# Patient Record
Sex: Female | Born: 1955 | Race: White | Hispanic: No | Smoking: Former smoker
Health system: Southern US, Community
[De-identification: ages and names within clinical notes are randomized; demographics above are authoritative.]

## PROBLEM LIST (undated history)

## (undated) DIAGNOSIS — J309 Allergic rhinitis, unspecified: Secondary | ICD-10-CM

## (undated) DIAGNOSIS — R5383 Other fatigue: Secondary | ICD-10-CM

## (undated) DIAGNOSIS — E559 Vitamin D deficiency, unspecified: Secondary | ICD-10-CM

## (undated) DIAGNOSIS — E039 Hypothyroidism, unspecified: Secondary | ICD-10-CM

## (undated) DIAGNOSIS — J45909 Unspecified asthma, uncomplicated: Secondary | ICD-10-CM

## (undated) DIAGNOSIS — Z90710 Acquired absence of both cervix and uterus: Secondary | ICD-10-CM

## (undated) DIAGNOSIS — G4730 Sleep apnea, unspecified: Secondary | ICD-10-CM

## (undated) DIAGNOSIS — J310 Chronic rhinitis: Secondary | ICD-10-CM

## (undated) DIAGNOSIS — G4710 Hypersomnia, unspecified: Secondary | ICD-10-CM

## (undated) DIAGNOSIS — G629 Polyneuropathy, unspecified: Secondary | ICD-10-CM

## (undated) DIAGNOSIS — I639 Cerebral infarction, unspecified: Secondary | ICD-10-CM

## (undated) DIAGNOSIS — R0600 Dyspnea, unspecified: Secondary | ICD-10-CM

## (undated) DIAGNOSIS — K219 Gastro-esophageal reflux disease without esophagitis: Secondary | ICD-10-CM

## (undated) DIAGNOSIS — M797 Fibromyalgia: Secondary | ICD-10-CM

## (undated) DIAGNOSIS — K5090 Crohn's disease, unspecified, without complications: Secondary | ICD-10-CM

## (undated) DIAGNOSIS — F329 Major depressive disorder, single episode, unspecified: Secondary | ICD-10-CM

## (undated) HISTORY — DX: Gastro-esophageal reflux disease without esophagitis: K21.9

## (undated) HISTORY — DX: Major depressive disorder, single episode, unspecified: F32.9

## (undated) HISTORY — DX: Crohn's disease, unspecified, without complications: K50.90

## (undated) HISTORY — DX: Vitamin D deficiency, unspecified: E55.9

## (undated) HISTORY — DX: Fibromyalgia: M79.7

## (undated) HISTORY — DX: Unspecified asthma, uncomplicated: J45.909

## (undated) HISTORY — DX: Allergic rhinitis, unspecified: J30.9

## (undated) HISTORY — DX: Hypersomnia, unspecified: G47.10

## (undated) HISTORY — DX: Hypothyroidism, unspecified: E03.9

## (undated) HISTORY — PX: CHOLECYSTECTOMY: SHX55

## (undated) HISTORY — DX: Polyneuropathy, unspecified: G62.9

## (undated) HISTORY — DX: Hypersomnia, unspecified: G47.30

## (undated) HISTORY — DX: Acquired absence of both cervix and uterus: Z90.710

## (undated) HISTORY — DX: Chronic rhinitis: J31.0

## (undated) HISTORY — DX: Other fatigue: R53.83

## (undated) HISTORY — DX: Dyspnea, unspecified: R06.00

---

## 1998-08-24 ENCOUNTER — Inpatient Hospital Stay (HOSPITAL_COMMUNITY): Admission: EM | Admit: 1998-08-24 | Discharge: 1998-08-28 | Payer: Self-pay | Admitting: Psychiatry

## 1998-12-27 ENCOUNTER — Encounter: Payer: Self-pay | Admitting: Pulmonary Disease

## 1998-12-27 ENCOUNTER — Inpatient Hospital Stay (HOSPITAL_COMMUNITY): Admission: EM | Admit: 1998-12-27 | Discharge: 1999-01-02 | Payer: Self-pay | Admitting: Pulmonary Disease

## 1998-12-28 ENCOUNTER — Encounter: Payer: Self-pay | Admitting: Pulmonary Disease

## 1999-01-01 ENCOUNTER — Encounter: Payer: Self-pay | Admitting: Pulmonary Disease

## 1999-03-12 ENCOUNTER — Inpatient Hospital Stay (HOSPITAL_COMMUNITY): Admission: RE | Admit: 1999-03-12 | Discharge: 1999-03-13 | Payer: Self-pay | Admitting: Specialist

## 1999-08-06 ENCOUNTER — Inpatient Hospital Stay (HOSPITAL_COMMUNITY): Admission: EM | Admit: 1999-08-06 | Discharge: 1999-08-15 | Payer: Self-pay | Admitting: *Deleted

## 1999-08-06 ENCOUNTER — Emergency Department (HOSPITAL_COMMUNITY): Admission: EM | Admit: 1999-08-06 | Discharge: 1999-08-06 | Payer: Self-pay | Admitting: Emergency Medicine

## 2000-01-13 ENCOUNTER — Inpatient Hospital Stay (HOSPITAL_COMMUNITY): Admission: EM | Admit: 2000-01-13 | Discharge: 2000-01-16 | Payer: Self-pay | Admitting: *Deleted

## 2000-01-16 ENCOUNTER — Inpatient Hospital Stay (HOSPITAL_COMMUNITY): Admission: EM | Admit: 2000-01-16 | Discharge: 2000-01-18 | Payer: Self-pay | Admitting: Emergency Medicine

## 2000-01-16 ENCOUNTER — Encounter: Payer: Self-pay | Admitting: *Deleted

## 2000-01-17 ENCOUNTER — Ambulatory Visit (HOSPITAL_COMMUNITY): Admission: RE | Admit: 2000-01-17 | Discharge: 2000-01-17 | Payer: Self-pay | Admitting: *Deleted

## 2000-01-17 ENCOUNTER — Encounter: Payer: Self-pay | Admitting: Pulmonary Disease

## 2000-01-18 ENCOUNTER — Inpatient Hospital Stay (HOSPITAL_COMMUNITY): Admission: EM | Admit: 2000-01-18 | Discharge: 2000-01-21 | Payer: Self-pay

## 2000-03-12 ENCOUNTER — Inpatient Hospital Stay (HOSPITAL_COMMUNITY): Admission: AD | Admit: 2000-03-12 | Discharge: 2000-03-13 | Payer: Self-pay | Admitting: Internal Medicine

## 2000-05-16 ENCOUNTER — Inpatient Hospital Stay (HOSPITAL_COMMUNITY): Admission: EM | Admit: 2000-05-16 | Discharge: 2000-05-18 | Payer: Self-pay | Admitting: *Deleted

## 2000-05-27 ENCOUNTER — Inpatient Hospital Stay (HOSPITAL_COMMUNITY): Admission: EM | Admit: 2000-05-27 | Discharge: 2000-06-01 | Payer: Self-pay | Admitting: *Deleted

## 2000-05-29 ENCOUNTER — Encounter: Payer: Self-pay | Admitting: Emergency Medicine

## 2000-06-09 ENCOUNTER — Inpatient Hospital Stay (HOSPITAL_COMMUNITY): Admission: EM | Admit: 2000-06-09 | Discharge: 2000-06-18 | Payer: Self-pay | Admitting: *Deleted

## 2000-06-14 ENCOUNTER — Encounter: Payer: Self-pay | Admitting: Internal Medicine

## 2000-12-22 ENCOUNTER — Inpatient Hospital Stay (HOSPITAL_COMMUNITY): Admission: EM | Admit: 2000-12-22 | Discharge: 2000-12-29 | Payer: Self-pay | Admitting: *Deleted

## 2001-03-16 ENCOUNTER — Inpatient Hospital Stay (HOSPITAL_COMMUNITY): Admission: EM | Admit: 2001-03-16 | Discharge: 2001-03-30 | Payer: Self-pay | Admitting: *Deleted

## 2001-03-30 ENCOUNTER — Inpatient Hospital Stay (HOSPITAL_COMMUNITY): Admission: EM | Admit: 2001-03-30 | Discharge: 2001-04-04 | Payer: Self-pay | Admitting: Emergency Medicine

## 2001-03-30 ENCOUNTER — Encounter: Payer: Self-pay | Admitting: Internal Medicine

## 2001-04-01 ENCOUNTER — Encounter: Payer: Self-pay | Admitting: Internal Medicine

## 2001-04-03 ENCOUNTER — Encounter: Payer: Self-pay | Admitting: Internal Medicine

## 2001-10-26 ENCOUNTER — Encounter: Payer: Self-pay | Admitting: Gastroenterology

## 2001-10-26 ENCOUNTER — Encounter: Admission: RE | Admit: 2001-10-26 | Discharge: 2001-10-26 | Payer: Self-pay | Admitting: Gastroenterology

## 2002-12-07 ENCOUNTER — Ambulatory Visit (HOSPITAL_COMMUNITY): Admission: RE | Admit: 2002-12-07 | Discharge: 2002-12-07 | Payer: Self-pay | Admitting: Gastroenterology

## 2003-04-01 ENCOUNTER — Encounter: Payer: Self-pay | Admitting: Gastroenterology

## 2003-04-01 ENCOUNTER — Inpatient Hospital Stay (HOSPITAL_COMMUNITY): Admission: EM | Admit: 2003-04-01 | Discharge: 2003-04-10 | Payer: Self-pay

## 2003-04-03 ENCOUNTER — Encounter: Payer: Self-pay | Admitting: Gastroenterology

## 2003-04-08 ENCOUNTER — Encounter: Payer: Self-pay | Admitting: Gastroenterology

## 2003-11-11 ENCOUNTER — Inpatient Hospital Stay (HOSPITAL_COMMUNITY): Admission: EM | Admit: 2003-11-11 | Discharge: 2003-11-17 | Payer: Self-pay | Admitting: Psychiatry

## 2004-05-01 ENCOUNTER — Inpatient Hospital Stay (HOSPITAL_COMMUNITY): Admission: EM | Admit: 2004-05-01 | Discharge: 2004-05-06 | Payer: Self-pay | Admitting: Psychiatry

## 2004-08-27 ENCOUNTER — Ambulatory Visit: Payer: Self-pay | Admitting: Oncology

## 2005-02-06 ENCOUNTER — Ambulatory Visit: Payer: Self-pay | Admitting: Psychiatry

## 2005-02-06 ENCOUNTER — Inpatient Hospital Stay (HOSPITAL_COMMUNITY): Admission: RE | Admit: 2005-02-06 | Discharge: 2005-02-10 | Payer: Self-pay | Admitting: Psychiatry

## 2005-02-27 ENCOUNTER — Ambulatory Visit: Payer: Self-pay | Admitting: Hematology and Oncology

## 2005-05-15 ENCOUNTER — Ambulatory Visit: Payer: Self-pay | Admitting: Psychiatry

## 2005-05-15 ENCOUNTER — Inpatient Hospital Stay (HOSPITAL_COMMUNITY): Admission: EM | Admit: 2005-05-15 | Discharge: 2005-05-18 | Payer: Self-pay | Admitting: Psychiatry

## 2005-05-20 ENCOUNTER — Ambulatory Visit: Payer: Self-pay | Admitting: Oncology

## 2005-11-11 ENCOUNTER — Ambulatory Visit: Payer: Self-pay | Admitting: Oncology

## 2006-12-11 ENCOUNTER — Encounter: Admission: RE | Admit: 2006-12-11 | Discharge: 2006-12-11 | Payer: Self-pay | Admitting: Internal Medicine

## 2012-09-15 ENCOUNTER — Other Ambulatory Visit (HOSPITAL_COMMUNITY): Payer: Self-pay | Admitting: Adult Health

## 2012-09-15 DIAGNOSIS — R0600 Dyspnea, unspecified: Secondary | ICD-10-CM

## 2012-09-21 ENCOUNTER — Encounter (HOSPITAL_COMMUNITY): Payer: Self-pay | Admitting: *Deleted

## 2012-09-21 ENCOUNTER — Encounter (HOSPITAL_COMMUNITY): Payer: Self-pay | Admitting: Internal Medicine

## 2012-09-28 ENCOUNTER — Encounter (HOSPITAL_COMMUNITY): Payer: Self-pay | Admitting: Pharmacy Technician

## 2012-10-06 ENCOUNTER — Ambulatory Visit (HOSPITAL_COMMUNITY)
Admission: RE | Admit: 2012-10-06 | Discharge: 2012-10-06 | Disposition: A | Discharge status: Home / Self Care | Payer: Medicare Other | Source: Ambulatory Visit | Attending: Internal Medicine | Admitting: Internal Medicine

## 2012-10-06 ENCOUNTER — Encounter (HOSPITAL_COMMUNITY)
Admission: RE | Disposition: A | Discharge status: Home / Self Care | Payer: Self-pay | Source: Ambulatory Visit | Attending: Internal Medicine

## 2012-10-06 ENCOUNTER — Other Ambulatory Visit: Payer: Self-pay

## 2012-10-06 DIAGNOSIS — R079 Chest pain, unspecified: Secondary | ICD-10-CM | POA: Insufficient documentation

## 2012-10-06 DIAGNOSIS — R0600 Dyspnea, unspecified: Secondary | ICD-10-CM

## 2012-10-06 DIAGNOSIS — I2510 Atherosclerotic heart disease of native coronary artery without angina pectoris: Secondary | ICD-10-CM | POA: Insufficient documentation

## 2012-10-06 DIAGNOSIS — Z7901 Long term (current) use of anticoagulants: Secondary | ICD-10-CM | POA: Insufficient documentation

## 2012-10-06 DIAGNOSIS — R0609 Other forms of dyspnea: Secondary | ICD-10-CM | POA: Insufficient documentation

## 2012-10-06 DIAGNOSIS — I279 Pulmonary heart disease, unspecified: Secondary | ICD-10-CM

## 2012-10-06 DIAGNOSIS — R0989 Other specified symptoms and signs involving the circulatory and respiratory systems: Secondary | ICD-10-CM | POA: Insufficient documentation

## 2012-10-06 HISTORY — PX: RIGHT HEART CATHETERIZATION: SHX5447

## 2012-10-06 LAB — POCT I-STAT 3, ART BLOOD GAS (G3+)
Bicarbonate: 25.1 mEq/L — ABNORMAL HIGH (ref 20.0–24.0)
pH, Arterial: 7.389 (ref 7.350–7.450)
pO2, Arterial: 86 mmHg (ref 80.0–100.0)

## 2012-10-06 LAB — POCT I-STAT 3, VENOUS BLOOD GAS (G3P V)
Bicarbonate: 26.9 mEq/L — ABNORMAL HIGH (ref 20.0–24.0)
TCO2: 28 mmol/L (ref 0–100)
TCO2: 30 mmol/L (ref 0–100)
pCO2, Ven: 48.3 mmHg (ref 45.0–50.0)
pH, Ven: 7.373 — ABNORMAL HIGH (ref 7.250–7.300)
pH, Ven: 7.376 — ABNORMAL HIGH (ref 7.250–7.300)
pO2, Ven: 29 mmHg — CL (ref 30.0–45.0)

## 2012-10-06 LAB — BASIC METABOLIC PANEL
CO2: 25 mEq/L (ref 19–32)
Calcium: 9.7 mg/dL (ref 8.4–10.5)
Creatinine, Ser: 1.21 mg/dL — ABNORMAL HIGH (ref 0.50–1.10)
GFR calc non Af Amer: 49 mL/min — ABNORMAL LOW (ref >90)
Sodium: 138 mEq/L (ref 135–145)

## 2012-10-06 LAB — CBC
MCH: 23.8 pg — ABNORMAL LOW (ref 26.0–34.0)
MCV: 75.8 fL — ABNORMAL LOW (ref 78.0–100.0)
Platelets: 262 10*3/uL (ref 150–400)
RDW: 15.1 % (ref 11.5–15.5)
WBC: 5.2 10*3/uL (ref 4.0–10.5)

## 2012-10-06 LAB — PROTIME-INR: Prothrombin Time: 24 seconds — ABNORMAL HIGH (ref 11.6–15.2)

## 2012-10-06 SURGERY — RIGHT HEART CATH
Anesthesia: LOCAL

## 2012-10-06 MED ORDER — SODIUM CHLORIDE 0.9 % IV SOLN
INTRAVENOUS | Status: DC
  Administered 2012-10-06: 10:00:00 via INTRAVENOUS

## 2012-10-06 MED ORDER — OXYCODONE HCL 5 MG PO TABS
ORAL_TABLET | ORAL | Status: AC
  Filled 2012-10-06: qty 6

## 2012-10-06 MED ORDER — NITROGLYCERIN 0.2 MG/ML ON CALL CATH LAB
INTRAVENOUS | Status: AC
  Filled 2012-10-06: qty 1

## 2012-10-06 MED ORDER — HEPARIN SODIUM (PORCINE) 1000 UNIT/ML IJ SOLN
INTRAMUSCULAR | Status: AC
  Filled 2012-10-06: qty 1

## 2012-10-06 MED ORDER — SODIUM CHLORIDE 0.9 % IJ SOLN: 3.0000 mL | Freq: Two times a day (BID) | INTRAMUSCULAR | Status: DC

## 2012-10-06 MED ORDER — HEPARIN (PORCINE) IN NACL 2-0.9 UNIT/ML-% IJ SOLN
INTRAMUSCULAR | Status: AC
  Filled 2012-10-06: qty 500

## 2012-10-06 MED ORDER — FENTANYL CITRATE 0.05 MG/ML IJ SOLN
INTRAMUSCULAR | Status: AC
  Filled 2012-10-06: qty 2

## 2012-10-06 MED ORDER — MIDAZOLAM HCL 2 MG/2ML IJ SOLN
INTRAMUSCULAR | Status: AC
  Filled 2012-10-06: qty 2

## 2012-10-06 MED ORDER — ASPIRIN 81 MG PO CHEW: 324.0000 mg | CHEWABLE_TABLET | ORAL | Status: DC

## 2012-10-06 MED ORDER — SODIUM CHLORIDE 0.9 % IJ SOLN: 3.0000 mL | INTRAMUSCULAR | Status: DC | PRN

## 2012-10-06 MED ORDER — SODIUM CHLORIDE 0.9 % IV SOLN: 250.0000 mL | INTRAVENOUS | Status: DC | PRN

## 2012-10-06 MED ORDER — OXYCODONE HCL 5 MG PO TABS
30.0000 mg | ORAL_TABLET | Freq: Once | ORAL | Status: AC
  Administered 2012-10-06: 30 mg via ORAL

## 2012-10-06 MED ORDER — VERAPAMIL HCL 2.5 MG/ML IV SOLN
INTRAVENOUS | Status: AC
  Filled 2012-10-06: qty 2

## 2012-10-06 MED ORDER — LIDOCAINE HCL (PF) 1 % IJ SOLN
INTRAMUSCULAR | Status: AC
  Filled 2012-10-06: qty 30

## 2012-10-06 NOTE — H&P (Signed)
Referring: Dr. Blenda Nicely  HPI:  Margaret Lowe is a 55 y/o woman with multiple medical problems including obesity, severe OSA on CPAP, DM2, fibromyalgia, bipolar d/o, arthrtitis, peripheral neuropathy referred by Dr. Blenda Nicely for right heart cath for further evaluation of her dyspnea.  Denies any known cardiac history. Says she had a cath many years ago but doesn't know the result. She smoked for 30 years almost 3 ppd but quit over 10 years ago.   She has a very limited functional capacity can only get around with a motorized scooted. Unable to walk due to neuropathy and dyspnea. Says with almost any activity she gets dyspnea and occasionally feels like she is going to pass out. On coumadin for h/o blood clots.   Currently wears O2 at night and with any activity. + edema. +orthopnea/PND. Sleeps in hospital bed. Occasionally gets CP going to arm and neck  Echo with Dr. Blenda Nicely  EF 58% Moderate to severe diastolic dysfunction. + pulmonary HTN. No mention of RV function  Lives alone.  Has CNA to come help.   Review of Systems:     Cardiac Review of Systems: {Y] = yes [ ]  = no  Chest Pain [ y   ]  Resting SOB Cove.Etienne   ] Exertional SOB  [ y ]  40 [ y ]   Pedal Edema [ y  ]    Palpitations Cove.Etienne  ] Syncope  [  ]   Presyncope [  y ]  General Review of Systems: [Y] = yes [  ]=no Constitional: recent weight change [  ]; anorexia [  ]; fatigue Cove.Etienne  ]; nausea [  ]; night sweats [  ]; fever [  ]; or chills [  ];                                                                                                                                          Dental: poor dentition[ y ];   Eye : blurred vision [  ]; diplopia [   ]; vision changes [  ];  Amaurosis fugax[  ]; Resp: cough [ y ];  wheezing[  y];  hemoptysis[  ]; shortness of breath[  y]; paroxysmal nocturnal dyspnea[  ]; dyspnea on exertion[  ]; or orthopnea[  ];  GI:  gallstones[  ], vomiting[ y ];  dysphagia[  ]; melena[  ];  hematochezia [  ]; heartburn[ y ];    GU: kidney stones [  ]; hematuria[  ];   dysuria [  ];  nocturia[  ];  history of     obstruction [  ];                 Skin: rash, swelling[  ];, hair loss[  ];  peripheral edema[  ];  or itching[  ]; Musculosketetal: myalgias[  y];  joint swelling[y  ];  joint erythema[  ];  joint pain[y  ];  back pain[y ];  Heme/Lymph: bruising[ y];  bleeding[  ];  anemia[  ];  Neuro: TIA[  ];  headaches[  ];  stroke[  ];  vertigo[  ];  seizures[  ];   paresthesias[ y];  difficulty walking[y ];  Psych:depression[ y]; anxiety[y ];  Endocrine: diabetes[y ];  thyroid dysfunction[  ];   Past Medical History  Diagnosis Date  . Major depression   . H/O total hysterectomy   . Fibromyalgia   . Hypothyroidism   . Crohn disease   . Neuropathy   . Extrinsic asthma   . Esophageal reflux   . Sleep apnea with hypersomnolence   . Chronic rhinitis   . Allergic rhinitis   . Fatigue   . Dyspnea   . Vitamin D deficiency     Medications Prior to Admission  Medication Sig Dispense Refill  . albuterol (PROVENTIL HFA) 108 (90 BASE) MCG/ACT inhaler Inhale 2 puffs into the lungs every 6 (six) hours as needed. For shortness of breath/wheezing      . ALPRAZolam (XANAX) 1 MG tablet Take 1 mg by mouth 4 (four) times daily.      . Azelastine-Fluticasone (DYMISTA) 137-50 MCG/ACT SUSP Place 2 sprays into the nose 2 (two) times daily.      . baclofen (LIORESAL) 10 MG tablet Take 10 mg by mouth 2 (two) times daily.      Marland Kitchen buPROPion (WELLBUTRIN SR) 150 MG 12 hr tablet Take 450 mg by mouth every morning.      . Cholecalciferol (VITAMIN D-3) 5000 UNITS TABS Take 1 tablet by mouth 2 (two) times a week. On Tues & Fri      . Cyanocobalamin (VITAMIN B-12 IJ) Inject 1 mL as directed every 30 (thirty) days.      . cyclobenzaprine (FLEXERIL) 10 MG tablet Take 10 mg by mouth 3 (three) times daily as needed. For muscle spasms      . esomeprazole (NEXIUM) 40 MG capsule Take 40 mg by mouth 2 (two) times daily.      . fenofibrate  (TRICOR) 145 MG tablet Take 145 mg by mouth at bedtime.      . Fluticasone-Salmeterol (ADVAIR DISKUS) 250-50 MCG/DOSE AEPB Inhale 1 puff into the lungs every 12 (twelve) hours.      . folic acid (FOLVITE) 1 MG tablet Take 2-3 mg by mouth 2 (two) times daily. 2 tabs in the am, 2 tabs in the pm      . furosemide (LASIX) 20 MG tablet Take 20 mg by mouth daily.       Marland Kitchen HYDROmorphone HCl (EXALGO) 16 MG T24A Take 1 tablet by mouth 3 (three) times daily.      . insulin glargine (LANTUS) 100 UNIT/ML injection Inject 15 Units into the skin at bedtime.      Marland Kitchen ipratropium-albuterol (DUONEB) 0.5-2.5 (3) MG/3ML SOLN Take 3 mLs by nebulization 5 (five) times daily as needed. For shortness of breath/wheezing      . levothyroxine (SYNTHROID, LEVOTHROID) 112 MCG tablet Take 112 mcg by mouth daily.      . Milnacipran (SAVELLA) 50 MG TABS Take 50 mg by mouth 2 (two) times daily.      . mometasone (NASONEX) 50 MCG/ACT nasal spray Place 2 sprays into the nose at bedtime.      . montelukast (SINGULAIR) 10 MG tablet Take 10 mg by mouth at bedtime.      . ondansetron (ZOFRAN-ODT) 8 MG disintegrating  tablet Take 8 mg by mouth every 8 (eight) hours as needed. For nausea/vomiting      . oxycodone (ROXICODONE) 30 MG immediate release tablet Take 30 mg by mouth 5 (five) times daily.      . pramipexole (MIRAPEX) 0.125 MG tablet Take 0.125 mg by mouth at bedtime.      . promethazine (PHENERGAN) 25 MG tablet Take 25 mg by mouth every 6 (six) hours as needed. For nausea/vomiting      . ramelteon (ROZEREM) 8 MG tablet Take 8 mg by mouth at bedtime.      . sitaGLIPtin (JANUVIA) 100 MG tablet Take 100 mg by mouth every morning.      . topiramate (TOPAMAX) 25 MG tablet Take 25 mg by mouth at bedtime.      . traZODone (DESYREL) 50 MG tablet Take 50 mg by mouth at bedtime.      Marland Kitchen warfarin (COUMADIN) 1 MG tablet Take 1.5 mg by mouth every evening.         Allergies  Allergen Reactions  . Aspirin   . Latex   . Pregabalin      History   Social History  . Marital Status: Legally Separated    Spouse Name: N/A    Number of Children: N/A  . Years of Education: N/A   Occupational History  . Not on file.   Social History Main Topics  . Smoking status: Former Smoker    Quit date: 10/21/1995  . Smokeless tobacco: Not on file  . Alcohol Use: Not on file  . Drug Use: Not on file  . Sexually Active: Not on file   Other Topics Concern  . Not on file   Social History Narrative  . No narrative on file  On disability. Separated. Lives at home alone. Previously a housewife. 3 grown kids. Former smoker  FHX:  M died 56 massive MI D alive. No known heart disease. 4 brothers and 4 sisters Oldest brother: severe CAD Other brother with MI  PHYSICAL EXAM: Filed Vitals:   10/06/12 0821  BP: 128/70  Pulse: 107  Temp: 99.4 F (37.4 C)  Resp: 18   General:  Obese chronically ill appearing No respiratory difficulty HEENT: edentulous otherwise normal Neck: supple. no obvious JVD. Carotids 2+ bilat; no bruits. No lymphadenopathy or thryomegaly appreciated. Cor: Regular rate & rhythm. No rubs, gallops or murmurs. Lungs: clear. Reduced breaths sounds throughout. No wheeze Abdomen: obese. Nontender, ondistended. No hepatosplenomegaly. No bruits or masses. Good bowel sounds. Extremities: no cyanosis, clubbing, rash, tr edema Neuro: alert & oriented x 3, cranial nerves grossly intact. moves all 4 extremities w/o difficulty. Affect pleasant.    ASSESSMENT:  1. Dyspnea with evidence of pHTN by echo 2. Chest pain 3. Chronic respiratory failure on home O2 4. Morbid obesity  5. Fibromyalgia 6. Deconditioning 7. Severe OSA 8. 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 Elevated pulmonary pressures may be due to elevated left-sided pressures but also at risk for mixed PAH. Agree with plan for RHC. Given CP radiating to arm in setting of multiple  CRFs will also plan coronary angio if INR 1.7 or less.   Daniel Bensimhon,MD 10:13 AM

## 2012-10-06 NOTE — CV Procedure (Signed)
Cardiac Cath Procedure Note:  Indication: Chest pain and pulmonary HTN on echo  Procedures performed:  1) Selective coronary angiography 2) Left heart catheterization 3) Left ventriculogram 4) Right heart cath  Description of procedure:   The risks and indication of the procedure were explained. Consent was signed and placed on the chart. An appropriate timeout was taken prior to the procedure. After a normal Allen's test was confirmed, the right wrist was prepped and draped in the routine sterile fashion and anesthetized with 1% local lidocaine.   A 5 FR arterial sheath was then placed in the right radial artery using a modified Seldinger technique. 2500 units systemic heparin was administered. 3mg  IV verapamil was given through the sheath. Standard catheters including a JL 3.5, JR4 and straight pigtail were used. All catheter exchanges were made over a wire.  A pre-existing peripheral IV was in the right AC fossa. This was prepped sterilely and changed out over a wire for a 5 FR sheath. A 5 FR swam was used for the RHC.   Contrast: 60 cc  Complications:  None apparent  Findings: RA = 7 RV = 30/5/9 PA = 27/12 (21) PW = 10 Ao Pressure: 137/82 (103)  LV Pressure: 155/10/13 PVR = 1.9 Woods  There was no signficant gradient across the aortic valve on pullback.  Left main: Normal  LAD: 20% plaque in proximal LAD. Otherwise normal.  LCX: Normal  RCA: Dominant. Normal.  LV-gram done in the RAO projection: Ejection fraction = 60%. No regional wall motion abnormalities.  Assessment: 1. Minimal non-obstructive coronary artery disease 2. Normal LV function 3. Normal R-sided pressures  Plan/Discussion:  Medical therapy.  Daniel Bensimhon 1:11 PM

## 2012-10-06 NOTE — Progress Notes (Addendum)
Dr Gala Romney notified pt did not receive aspirin due to allergy, MD ok,  will continue to monitor

## 2012-10-06 NOTE — Progress Notes (Signed)
Notified Thereasa Distance in cath lab pt INR 2.26, will continue to monitor

## 2013-08-25 DIAGNOSIS — G603 Idiopathic progressive neuropathy: Secondary | ICD-10-CM

## 2013-08-25 DIAGNOSIS — G5600 Carpal tunnel syndrome, unspecified upper limb: Secondary | ICD-10-CM

## 2013-08-25 HISTORY — DX: Idiopathic progressive neuropathy: G60.3

## 2013-08-25 HISTORY — DX: Carpal tunnel syndrome, unspecified upper limb: G56.00

## 2014-09-28 ENCOUNTER — Encounter (HOSPITAL_COMMUNITY): Payer: Self-pay | Admitting: Internal Medicine

## 2015-02-15 ENCOUNTER — Encounter (HOSPITAL_COMMUNITY): Payer: Medicare Other

## 2016-03-26 DIAGNOSIS — R404 Transient alteration of awareness: Secondary | ICD-10-CM

## 2016-03-26 HISTORY — DX: Transient alteration of awareness: R40.4

## 2016-04-10 DIAGNOSIS — E663 Overweight: Secondary | ICD-10-CM | POA: Insufficient documentation

## 2016-04-10 DIAGNOSIS — M797 Fibromyalgia: Secondary | ICD-10-CM | POA: Insufficient documentation

## 2016-04-10 DIAGNOSIS — E039 Hypothyroidism, unspecified: Secondary | ICD-10-CM | POA: Insufficient documentation

## 2016-04-10 HISTORY — DX: Overweight: E66.3

## 2016-04-10 HISTORY — DX: Hypothyroidism, unspecified: E03.9

## 2016-04-24 DIAGNOSIS — M79675 Pain in left toe(s): Secondary | ICD-10-CM | POA: Insufficient documentation

## 2016-04-24 DIAGNOSIS — M2041 Other hammer toe(s) (acquired), right foot: Secondary | ICD-10-CM

## 2016-04-24 DIAGNOSIS — M2042 Other hammer toe(s) (acquired), left foot: Secondary | ICD-10-CM | POA: Insufficient documentation

## 2016-04-24 HISTORY — DX: Other hammer toe(s) (acquired), right foot: M20.41

## 2016-04-24 HISTORY — DX: Pain in left toe(s): M79.675

## 2016-05-09 DIAGNOSIS — Z993 Dependence on wheelchair: Secondary | ICD-10-CM

## 2016-05-09 DIAGNOSIS — R42 Dizziness and giddiness: Secondary | ICD-10-CM

## 2016-05-09 DIAGNOSIS — Z794 Long term (current) use of insulin: Secondary | ICD-10-CM

## 2016-05-09 DIAGNOSIS — F411 Generalized anxiety disorder: Secondary | ICD-10-CM | POA: Insufficient documentation

## 2016-05-09 DIAGNOSIS — N3000 Acute cystitis without hematuria: Secondary | ICD-10-CM

## 2016-05-09 DIAGNOSIS — Z87898 Personal history of other specified conditions: Secondary | ICD-10-CM

## 2016-05-09 DIAGNOSIS — K581 Irritable bowel syndrome with constipation: Secondary | ICD-10-CM

## 2016-05-09 DIAGNOSIS — E119 Type 2 diabetes mellitus without complications: Secondary | ICD-10-CM

## 2016-05-09 DIAGNOSIS — R5381 Other malaise: Secondary | ICD-10-CM | POA: Insufficient documentation

## 2016-05-09 DIAGNOSIS — Z79899 Other long term (current) drug therapy: Secondary | ICD-10-CM

## 2016-05-09 HISTORY — DX: Dizziness and giddiness: R42

## 2016-05-09 HISTORY — DX: Personal history of other specified conditions: Z87.898

## 2016-05-09 HISTORY — DX: Long term (current) use of insulin: Z79.4

## 2016-05-09 HISTORY — DX: Acute cystitis without hematuria: N30.00

## 2016-05-09 HISTORY — DX: Other long term (current) drug therapy: Z79.899

## 2016-05-09 HISTORY — DX: Other malaise: R53.81

## 2016-05-09 HISTORY — DX: Type 2 diabetes mellitus without complications: E11.9

## 2016-05-09 HISTORY — DX: Dependence on wheelchair: Z99.3

## 2016-05-09 HISTORY — DX: Generalized anxiety disorder: F41.1

## 2016-05-09 HISTORY — DX: Irritable bowel syndrome with constipation: K58.1

## 2016-09-01 ENCOUNTER — Encounter (HOSPITAL_COMMUNITY): Payer: Self-pay | Admitting: Emergency Medicine

## 2016-09-01 DIAGNOSIS — Z79899 Other long term (current) drug therapy: Secondary | ICD-10-CM | POA: Diagnosis not present

## 2016-09-01 DIAGNOSIS — Z7901 Long term (current) use of anticoagulants: Secondary | ICD-10-CM | POA: Insufficient documentation

## 2016-09-01 DIAGNOSIS — Z794 Long term (current) use of insulin: Secondary | ICD-10-CM | POA: Insufficient documentation

## 2016-09-01 DIAGNOSIS — E039 Hypothyroidism, unspecified: Secondary | ICD-10-CM | POA: Insufficient documentation

## 2016-09-01 DIAGNOSIS — Z7984 Long term (current) use of oral hypoglycemic drugs: Secondary | ICD-10-CM | POA: Diagnosis not present

## 2016-09-01 DIAGNOSIS — R55 Syncope and collapse: Secondary | ICD-10-CM | POA: Diagnosis present

## 2016-09-01 DIAGNOSIS — G4730 Sleep apnea, unspecified: Secondary | ICD-10-CM | POA: Insufficient documentation

## 2016-09-01 LAB — CBC
HEMATOCRIT: 35.9 % — AB (ref 36.0–46.0)
HEMOGLOBIN: 11.1 g/dL — AB (ref 12.0–15.0)
MCH: 25.6 pg — ABNORMAL LOW (ref 26.0–34.0)
MCHC: 30.9 g/dL (ref 30.0–36.0)
MCV: 82.9 fL (ref 78.0–100.0)
Platelets: 279 10*3/uL (ref 150–400)
RBC: 4.33 MIL/uL (ref 3.87–5.11)
RDW: 15.9 % — ABNORMAL HIGH (ref 11.5–15.5)
WBC: 5.2 10*3/uL (ref 4.0–10.5)

## 2016-09-01 NOTE — ED Triage Notes (Signed)
Pt. reports multiple syncopal episodes today with generalized weakness/fatigue , denies injury , alert and oriented at arrival / respirations unlabored , no neuro deficits , speech clear/ no facial asymmetry , equal grips with no arm drift.

## 2016-09-02 ENCOUNTER — Emergency Department (HOSPITAL_COMMUNITY)
Admission: EM | Admit: 2016-09-02 | Discharge: 2016-09-02 | Disposition: A | Discharge status: Home / Self Care | Payer: Medicare Other | Attending: Emergency Medicine | Admitting: Emergency Medicine

## 2016-09-02 ENCOUNTER — Emergency Department (HOSPITAL_COMMUNITY): Payer: Medicare Other

## 2016-09-02 DIAGNOSIS — R55 Syncope and collapse: Secondary | ICD-10-CM

## 2016-09-02 DIAGNOSIS — G4730 Sleep apnea, unspecified: Secondary | ICD-10-CM

## 2016-09-02 LAB — URINE MICROSCOPIC-ADD ON: RBC / HPF: NONE SEEN RBC/hpf (ref 0–5)

## 2016-09-02 LAB — URINALYSIS, ROUTINE W REFLEX MICROSCOPIC
Bilirubin Urine: NEGATIVE
Glucose, UA: NEGATIVE mg/dL
Ketones, ur: NEGATIVE mg/dL
Nitrite: NEGATIVE
Protein, ur: NEGATIVE mg/dL
Specific Gravity, Urine: 1.017 (ref 1.005–1.030)
pH: 5 (ref 5.0–8.0)

## 2016-09-02 LAB — BASIC METABOLIC PANEL
Anion gap: 8 (ref 5–15)
BUN: 13 mg/dL (ref 6–20)
CO2: 27 mmol/L (ref 22–32)
Calcium: 8.8 mg/dL — ABNORMAL LOW (ref 8.9–10.3)
Chloride: 107 mmol/L (ref 101–111)
Creatinine, Ser: 1.42 mg/dL — ABNORMAL HIGH (ref 0.44–1.00)
GFR calc Af Amer: 45 mL/min — ABNORMAL LOW (ref >60)
GFR calc non Af Amer: 39 mL/min — ABNORMAL LOW (ref >60)
Glucose, Bld: 143 mg/dL — ABNORMAL HIGH (ref 65–99)
Potassium: 3.4 mmol/L — ABNORMAL LOW (ref 3.5–5.1)
Sodium: 142 mmol/L (ref 135–145)

## 2016-09-02 LAB — TROPONIN I: Troponin I: <0.03 ng/mL (ref <0.03)

## 2016-09-02 MED ORDER — CEPHALEXIN 500 MG PO CAPS: 500.0000 mg | ORAL_CAPSULE | Freq: Two times a day (BID) | ORAL | 0 refills | Status: DC

## 2016-09-02 MED ORDER — DEXTROSE 5 % IV SOLN
1.0000 g | Freq: Once | INTRAVENOUS | Status: AC
  Administered 2016-09-02: 1 g via INTRAVENOUS
  Filled 2016-09-02: qty 10

## 2016-09-02 NOTE — ED Notes (Signed)
Patient transported to X-ray 

## 2016-09-02 NOTE — ED Provider Notes (Signed)
[facility]-EMERGENCY DEPT Provider Note   CSN: [id number] Arrival date & time: 09/01/16  2316     History   Chief Complaint Chief Complaint  Patient presents with  . Loss of Consciousness    Syncope    HPI Margaret Lowe is a 60 y.o. female.  HPI   Patient has multiple PMH comes to the ER today with increase in frequency of LOC. She has been loosing conciousness since this past summer. Usually she has the feeling she might pass out, today she had multiple episodes of syncope but denies having any warning symptoms. Reports being at the sink around 4:30 am in the morning and then waking up around 1 pm by the doorbell and woke up by the sink again with no recollection of anything inbetween.  Patient is supposed to be on CPAP machine and 24/7 oxygen but says she only uses the CPAP the first part of the morning. She also takes Trazodone. Her symptoms have been persisting for the past 6 weeks. Denies fever, abdominal pain, injury, neck pain, back pain, confusion, AMS, change in vision.  Past Medical History:  Diagnosis Date  . Allergic rhinitis   . Chronic rhinitis   . Crohn disease (HCC)   . Dyspnea   . Esophageal reflux   . Extrinsic asthma   . Fatigue   . Fibromyalgia   . H/O total hysterectomy   . Hypothyroidism   . Major depression   . Neuropathy (HCC)   . Sleep apnea with hypersomnolence   . Vitamin D deficiency     There are no active problems to display for this patient.   Past Surgical History:  Procedure Laterality Date  . CHOLECYSTECTOMY    . RIGHT HEART CATHETERIZATION N/A 10/06/2012   Procedure: RIGHT HEART CATH;  Surgeon: Dolores Patty, MD;  Location: [facility] CATH LAB;  Service: Cardiovascular;  Laterality: N/A;    OB History    No data available       Home Medications    Prior to Admission medications   Medication Sig Start Date End Date Taking? Authorizing Provider  albuterol (PROVENTIL HFA) 108 (90 BASE) MCG/ACT inhaler Inhale 2 puffs into the lungs  every 6 (six) hours as needed. For shortness of breath/wheezing    Historical Provider, MD  ALPRAZolam Prudy Feeler) 1 MG tablet Take 1 mg by mouth 4 (four) times daily.    Historical Provider, MD  Azelastine-Fluticasone (DYMISTA) 137-50 MCG/ACT SUSP Place 2 sprays into the nose 2 (two) times daily.    Historical Provider, MD  baclofen (LIORESAL) 10 MG tablet Take 10 mg by mouth 2 (two) times daily.    Historical Provider, MD  buPROPion (WELLBUTRIN SR) 150 MG 12 hr tablet Take 450 mg by mouth every morning.    Historical Provider, MD  cephALEXin (KEFLEX) 500 MG capsule Take 1 capsule (500 mg total) by mouth 2 (two) times daily. 09/02/16   Marlon Pel, PA-C  Cholecalciferol (VITAMIN D-3) 5000 UNITS TABS Take 1 tablet by mouth 2 (two) times a week. On Tues & Fri    Historical Provider, MD  Cyanocobalamin (VITAMIN B-12 IJ) Inject 1 mL as directed every 30 (thirty) days.    Historical Provider, MD  cyclobenzaprine (FLEXERIL) 10 MG tablet Take 10 mg by mouth 3 (three) times daily as needed. For muscle spasms    Historical Provider, MD  esomeprazole (NEXIUM) 40 MG capsule Take 40 mg by mouth 2 (two) times daily.    Historical Provider, MD  fenofibrate (TRICOR) 145  MG tablet Take 145 mg by mouth at bedtime.    Historical Provider, MD  Fluticasone-Salmeterol (ADVAIR DISKUS) 250-50 MCG/DOSE AEPB Inhale 1 puff into the lungs every 12 (twelve) hours.    Historical Provider, MD  folic acid (FOLVITE) 1 MG tablet Take 2-3 mg by mouth 2 (two) times daily. 2 tabs in the am, 2 tabs in the pm    Historical Provider, MD  furosemide (LASIX) 20 MG tablet Take 20 mg by mouth daily.     Historical Provider, MD  HYDROmorphone HCl (EXALGO) 16 MG T24A Take 1 tablet by mouth 3 (three) times daily.    Historical Provider, MD  insulin glargine (LANTUS) 100 UNIT/ML injection Inject 15 Units into the skin at bedtime.    Historical Provider, MD  ipratropium-albuterol (DUONEB) 0.5-2.5 (3) MG/3ML SOLN Take 3 mLs by nebulization 5 (five)  times daily as needed. For shortness of breath/wheezing    Historical Provider, MD  levothyroxine (SYNTHROID, LEVOTHROID) 112 MCG tablet Take 112 mcg by mouth daily.    Historical Provider, MD  Milnacipran (SAVELLA) 50 MG TABS Take 50 mg by mouth 2 (two) times daily.    Historical Provider, MD  mometasone (NASONEX) 50 MCG/ACT nasal spray Place 2 sprays into the nose at bedtime.    Historical Provider, MD  montelukast (SINGULAIR) 10 MG tablet Take 10 mg by mouth at bedtime.    Historical Provider, MD  ondansetron (ZOFRAN-ODT) 8 MG disintegrating tablet Take 8 mg by mouth every 8 (eight) hours as needed. For nausea/vomiting    Historical Provider, MD  oxycodone (ROXICODONE) 30 MG immediate release tablet Take 30 mg by mouth 5 (five) times daily.    Historical Provider, MD  pramipexole (MIRAPEX) 0.125 MG tablet Take 0.125 mg by mouth at bedtime.    Historical Provider, MD  promethazine (PHENERGAN) 25 MG tablet Take 25 mg by mouth every 6 (six) hours as needed. For nausea/vomiting    Historical Provider, MD  ramelteon (ROZEREM) 8 MG tablet Take 8 mg by mouth at bedtime.    Historical Provider, MD  sitaGLIPtin (JANUVIA) 100 MG tablet Take 100 mg by mouth every morning.    Historical Provider, MD  topiramate (TOPAMAX) 25 MG tablet Take 25 mg by mouth at bedtime.    Historical Provider, MD  traZODone (DESYREL) 50 MG tablet Take 50 mg by mouth at bedtime.    Historical Provider, MD  warfarin (COUMADIN) 1 MG tablet Take 1.5 mg by mouth every evening.    Historical Provider, MD    Family History No family history on file.  Social History Social History  Substance Use Topics  . Smoking status: Former Smoker    Quit date: 10/21/1995  . Smokeless tobacco: Never Used  . Alcohol use Not on file     Allergies   Aspirin; Latex; and Pregabalin   Review of Systems Review of Systems Review of Systems All other systems negative except as documented in the HPI. All pertinent positives and negatives as  reviewed in the HPI.   Physical Exam Updated Vital Signs BP 109/56   Pulse 75   Temp 98.4 F (36.9 C) (Oral)   Resp 14   Ht 5' 5.5" (1.664 m)   Wt 81.6 kg   SpO2 100%   BMI 29.50 kg/m   Physical Exam  Constitutional: She appears well-developed and well-nourished. No distress.  HENT:  Head: Normocephalic and atraumatic.  Right Ear: Tympanic membrane and ear canal normal.  Left Ear: Tympanic membrane and ear canal normal.  Nose: Nose normal.  Mouth/Throat: Uvula is midline, oropharynx is clear and moist and mucous membranes are normal.  Eyes: Pupils are equal, round, and reactive to light.  Neck: Normal range of motion. Neck supple.  Cardiovascular: Normal rate and regular rhythm.   Pulmonary/Chest: Effort normal.  Abdominal: Soft.  No signs of abdominal distention  Musculoskeletal:  No LE swelling  Neurological: She is alert.  Acting at baseline Cranial nerves grossly intact on exam. Pt alert and oriented x 3 Upper extremity strength is symmetrical and physiologic, pt is wheel chair bound, able to move legs minimally. Normal muscular tone No facial droop Coordination intact, no limb ataxia,No pronator drift  Skin: Skin is warm and dry. No rash noted.  Nursing note and vitals reviewed.    ED Treatments / Results  Labs (all labs ordered are listed, but only abnormal results are displayed) Labs Reviewed  BASIC METABOLIC PANEL - Abnormal; Notable for the following:       Result Value   Potassium 3.4 (*)    Glucose, Bld 143 (*)    Creatinine, Ser 1.42 (*)    Calcium 8.8 (*)    GFR calc non Af Amer 39 (*)    GFR calc Af Amer 45 (*)    All other components within normal limits  CBC - Abnormal; Notable for the following:    Hemoglobin 11.1 (*)    HCT 35.9 (*)    MCH 25.6 (*)    RDW 15.9 (*)    All other components within normal limits  URINALYSIS, ROUTINE W REFLEX MICROSCOPIC (NOT AT [facility]) - Abnormal; Notable for the following:    APPearance CLOUDY (*)    Hgb  urine dipstick SMALL (*)    Leukocytes, UA LARGE (*)    All other components within normal limits  URINE MICROSCOPIC-ADD ON - Abnormal; Notable for the following:    Squamous Epithelial / LPF 0-5 (*)    Bacteria, UA MANY (*)    All other components within normal limits  URINE CULTURE  TROPONIN I    EKG  EKG Interpretation  Date/Time:  Monday September 01 2016 23:29:19 EST Ventricular Rate:  90 PR Interval:  166 QRS Duration: 134 QT Interval:  382 QTC Calculation: 467 R Axis:   81 Text Interpretation:  Normal sinus rhythm Right bundle branch block Abnormal ECG when compared to Dec 2013, incomplete RBB now RBBB, otherwise unchanged Confirmed by Blinda Leatherwood  MD, CHRISTOPHER [phone]) on 09/02/2016 12:45:29 AM       Radiology Dg Chest 2 View  Result Date: 09/02/2016 CLINICAL DATA:  Midchest pain and dyspnea tonight. EXAM: CHEST  2 VIEW COMPARISON:  10/25/2015 FINDINGS: The lungs are clear. The pulmonary vasculature is normal. Heart size is normal. Hilar and mediastinal contours are unremarkable. There is no pleural effusion. IMPRESSION: No active cardiopulmonary disease. Electronically Signed   By: Ellery Plunk M.D.   On: 09/02/2016 02:30    Procedures Procedures (including critical care time)  Medications Ordered in ED Medications  cefTRIAXone (ROCEPHIN) 1 g in dextrose 5 % 50 mL IVPB (1 g Intravenous New Bag/Given 09/02/16 0317)     Initial Impression / Assessment and Plan / ED Course  I have reviewed the triage vital signs and the nursing notes.  Pertinent labs & imaging results that were available during my care of the patient were reviewed by me and considered in my medical decision making (see chart for details).  Clinical Course     Pt has sleep apnea, not  complaint with CPAP, also takes Trazodone. Sounds like she is falling asleep, friend says she wakes up from her "syncopal" episodes if she hears any noises. While in the room the patient appears to have fallen  asleep and woke up with noise. Pt does have a UTI, given IV rocephin in the ED, rx Keflex.  Dr. Blinda Leatherwoodpollina has seen patient as well, we have offered patient admission/obs and she prefers home. She will f/u with PCP regarding CPAP and daytime sleeping. Understands return precautions.  Final Clinical Impressions(s) / ED Diagnoses   Final diagnoses:  Sleep apnea, unspecified type  Syncope, unspecified syncope type    New Prescriptions New Prescriptions   CEPHALEXIN (KEFLEX) 500 MG CAPSULE    Take 1 capsule (500 mg total) by mouth 2 (two) times daily.     Marlon Peliffany Curlie Macken, PA-C 09/02/16 [id number]    Gilda Creasehristopher J Pollina, MD 09/02/16 [phone]

## 2016-09-04 LAB — URINE CULTURE: Culture: >=100000 — AB

## 2016-09-05 ENCOUNTER — Telehealth (HOSPITAL_COMMUNITY): Payer: Self-pay

## 2016-09-05 NOTE — Telephone Encounter (Signed)
Post ED Visit - Positive Culture Follow-up  Culture report reviewed by antimicrobial stewardship pharmacist:  []  Enzo Bi, Pharm.D. []  Celedonio Miyamoto, Pharm.D., BCPS []  Garvin Fila, Pharm.D. []  Georgina Pillion, Pharm.D., BCPS []  [place], [place].D., BCPS, AAHIVP []  Estella Husk, Pharm.D., BCPS, AAHIVP []  Tennis Must, [address].D. []  Sherle Poe, [place].D. Ladona Horns, Pharm.D.   Positive urine culture, >/= 100,000 colonies -> E Coli Treated with Cephalexin, organism sensitive to the same and no further patient follow-up is required at this time.   Arvid Right 09/05/2016, 9:47 AM

## 2017-01-27 ENCOUNTER — Encounter (HOSPITAL_COMMUNITY): Payer: Self-pay

## 2017-01-27 DIAGNOSIS — Z8673 Personal history of transient ischemic attack (TIA), and cerebral infarction without residual deficits: Secondary | ICD-10-CM | POA: Insufficient documentation

## 2017-01-27 DIAGNOSIS — Z794 Long term (current) use of insulin: Secondary | ICD-10-CM | POA: Diagnosis not present

## 2017-01-27 DIAGNOSIS — E039 Hypothyroidism, unspecified: Secondary | ICD-10-CM | POA: Insufficient documentation

## 2017-01-27 DIAGNOSIS — Z87891 Personal history of nicotine dependence: Secondary | ICD-10-CM | POA: Diagnosis not present

## 2017-01-27 DIAGNOSIS — Z79899 Other long term (current) drug therapy: Secondary | ICD-10-CM | POA: Diagnosis not present

## 2017-01-27 DIAGNOSIS — R55 Syncope and collapse: Secondary | ICD-10-CM | POA: Diagnosis not present

## 2017-01-27 DIAGNOSIS — N3000 Acute cystitis without hematuria: Secondary | ICD-10-CM | POA: Diagnosis not present

## 2017-01-27 DIAGNOSIS — Z7901 Long term (current) use of anticoagulants: Secondary | ICD-10-CM | POA: Diagnosis not present

## 2017-01-27 DIAGNOSIS — Z91040 Latex allergy status: Secondary | ICD-10-CM | POA: Diagnosis not present

## 2017-01-27 DIAGNOSIS — R1030 Lower abdominal pain, unspecified: Secondary | ICD-10-CM | POA: Diagnosis present

## 2017-01-27 NOTE — ED Triage Notes (Signed)
Pt comes in with multiple medical complaint; pt c/o bilateral leg swelling, neck, and  Head pain; pt states she has been having syncopal episodes x 3 weeks; pt states she lives alone and can not walk; pt has hx of "mini strokes"; pt states she had syncope episode today; daughter is in triage; pt states she is confused  When she comes too but episodes is always unwitnessed; pt a&ox 4 on arrival; pt and daughter mention that pt has UTI and does not believe it has been cleared up;

## 2017-01-28 ENCOUNTER — Emergency Department (HOSPITAL_COMMUNITY)
Admission: EM | Admit: 2017-01-28 | Discharge: 2017-01-28 | Disposition: A | Discharge status: Home / Self Care | Payer: Medicare Other | Attending: Emergency Medicine | Admitting: Emergency Medicine

## 2017-01-28 ENCOUNTER — Emergency Department (HOSPITAL_COMMUNITY): Payer: Medicare Other

## 2017-01-28 DIAGNOSIS — R55 Syncope and collapse: Secondary | ICD-10-CM

## 2017-01-28 DIAGNOSIS — N3000 Acute cystitis without hematuria: Secondary | ICD-10-CM

## 2017-01-28 HISTORY — DX: Cerebral infarction, unspecified: I63.9

## 2017-01-28 LAB — BASIC METABOLIC PANEL
ANION GAP: 11 (ref 5–15)
BUN: 17 mg/dL (ref 6–20)
CALCIUM: 9.2 mg/dL (ref 8.9–10.3)
CO2: 29 mmol/L (ref 22–32)
CREATININE: 1.37 mg/dL — AB (ref 0.44–1.00)
Chloride: 102 mmol/L (ref 101–111)
GFR calc Af Amer: 47 mL/min — ABNORMAL LOW (ref >60)
GFR, EST NON AFRICAN AMERICAN: 41 mL/min — AB (ref >60)
GLUCOSE: 162 mg/dL — AB (ref 65–99)
Potassium: 3.4 mmol/L — ABNORMAL LOW (ref 3.5–5.1)
Sodium: 142 mmol/L (ref 135–145)

## 2017-01-28 LAB — URINALYSIS, ROUTINE W REFLEX MICROSCOPIC
BILIRUBIN URINE: NEGATIVE
GLUCOSE, UA: NEGATIVE mg/dL
Hgb urine dipstick: NEGATIVE
KETONES UR: NEGATIVE mg/dL
NITRITE: NEGATIVE
PH: 5 (ref 5.0–8.0)
Protein, ur: NEGATIVE mg/dL
Specific Gravity, Urine: 1.019 (ref 1.005–1.030)

## 2017-01-28 LAB — CBC
HEMATOCRIT: 33.8 % — AB (ref 36.0–46.0)
HEMOGLOBIN: 10.5 g/dL — AB (ref 12.0–15.0)
MCH: 24.9 pg — AB (ref 26.0–34.0)
MCHC: 31.1 g/dL (ref 30.0–36.0)
MCV: 80.1 fL (ref 78.0–100.0)
Platelets: 264 10*3/uL (ref 150–400)
RBC: 4.22 MIL/uL (ref 3.87–5.11)
RDW: 17.3 % — ABNORMAL HIGH (ref 11.5–15.5)
WBC: 5.3 10*3/uL (ref 4.0–10.5)

## 2017-01-28 LAB — D-DIMER, QUANTITATIVE: D-Dimer, Quant: 0.33 ug/mL-FEU (ref 0.00–0.50)

## 2017-01-28 LAB — I-STAT TROPONIN, ED: Troponin i, poc: 0 ng/mL (ref 0.00–0.08)

## 2017-01-28 MED ORDER — SULFAMETHOXAZOLE-TRIMETHOPRIM 800-160 MG PO TABS: 1.0000 | ORAL_TABLET | Freq: Two times a day (BID) | ORAL | 0 refills | Status: AC

## 2017-01-28 MED ORDER — SULFAMETHOXAZOLE-TRIMETHOPRIM 800-160 MG PO TABS
1.0000 | ORAL_TABLET | Freq: Once | ORAL | Status: AC
  Administered 2017-01-28: 1 via ORAL
  Filled 2017-01-28: qty 1

## 2017-01-28 NOTE — ED Provider Notes (Signed)
[facility]-EMER[facility]ENCY DEPT Provider Note   CSN: [id number] Arrival date & time: 01/27/17  2300     History   Chief Complaint Chief Complaint  Patient presents with  . Leg Swelling  . Loss of Consciousness  . Urinary Tract Infection    HPI Margaret Lowe is a 61 y.o. female.  HPI  This is a 61 year old female who presents with multiple complaints. Patient reports several week history of syncope. She reports recurrent syncope. Multiple times daily. These have been unwitnessed events. She states that at times she feels that she is given a pass out but other times she does not. At baseline she has shortness of breath and is on home oxygen. She denies any chest pain. Patient also reports persistent dysuria and suprapubic pain. She recently was treated for urinary tract infection. She states that she has finished her antibiotics. Additionally she reports bilateral lower [place] swelling. She states "I feel like my fear to pop." She states that she is supposed to be on a daily diuretic but takes it 3 times a week per her home health nurse.  Past Medical History:  Diagnosis Date  . Allergic rhinitis   . Chronic rhinitis   . Crohn disease (HCC)   . Dyspnea   . Esophageal reflux   . Extrinsic asthma   . Fatigue   . Fibromyalgia   . H/O total hysterectomy   . Hypothyroidism   . Major depression   . Neuropathy (HCC)   . Sleep apnea with hypersomnolence   . Stroke (HCC)   . Vitamin D deficiency     There are no active problems to display for this patient.   Past Surgical History:  Procedure Laterality Date  . CHOLECYSTECTOMY    . RIGHT HEART CATHETERIZATION N/A 10/06/2012   Procedure: RIGHT HEART CATH;  Surgeon: Dolores Patty, MD;  Location: [facility] CATH LAB;  Service: Cardiovascular;  Laterality: N/A;    OB History    No data available       Home Medications    Prior to Admission medications   Medication Sig Start Date End Date Taking? Authorizing Provider  acetaminophen  (TYLENOL) 500 MG tablet Take 500 mg by mouth every 6 (six) hours as needed for mild pain.   Yes Historical Provider, MD  albuterol (PROVENTIL HFA) 108 (90 BASE) MCG/ACT inhaler Inhale 2 puffs into the lungs every 6 (six) hours as needed. For shortness of breath/wheezing   Yes Historical Provider, MD  albuterol (PROVENTIL) (2.5 MG/3ML) 0.083% nebulizer solution Take 2.5 mg by nebulization every 6 (six) hours as needed for wheezing or shortness of breath.   Yes Historical Provider, MD  ALPRAZolam Prudy Feeler) 1 MG tablet Take 1 mg by mouth 2 (two) times daily.    Yes Historical Provider, MD  apixaban (ELIQUIS) 5 MG TABS tablet Take 5 mg by mouth 2 (two) times daily.   Yes Historical Provider, MD  baclofen (LIORESAL) 20 MG tablet Take 20 mg by mouth 3 (three) times daily.   Yes Historical Provider, MD  buPROPion (WELLBUTRIN XL) 300 MG 24 hr tablet Take 300 mg by mouth daily. 12/03/16  Yes Historical Provider, MD  Cyanocobalamin (VITAMIN B-12 IJ) Inject 1 mL as directed every 30 (thirty) days.   Yes Historical Provider, MD  cyclobenzaprine (FLEXERIL) 10 MG tablet Take 10 mg by mouth 3 (three) times daily. For muscle spasms   Yes Historical Provider, MD  docusate sodium (COLACE) 100 MG capsule Take 200 mg by mouth daily.  Yes Historical Provider, MD  esomeprazole (NEXIUM) 40 MG capsule Take 40 mg by mouth 2 (two) times daily.   Yes Historical Provider, MD  fenofibrate (TRICOR) 145 MG tablet Take 145 mg by mouth at bedtime.   Yes Historical Provider, MD  Flaxseed, Linseed, (FLAX SEED OIL PO) Take 1 tablet by mouth daily.   Yes Historical Provider, MD  fluPHENAZine (PROLIXIN) 1 MG tablet Take 1 mg by mouth daily as needed.   Yes Historical Provider, MD  folic acid (FOLVITE) 1 MG tablet Take 2-3 mg by mouth See admin instructions. Take 3 tablets at breakfast then take 2 tablets at lunch   Yes Historical Provider, MD  furosemide (LASIX) 20 MG tablet Take 20 mg by mouth every Monday, Wednesday, and Friday.    Yes  Historical Provider, MD  insulin aspart (NOVOLOG) 100 UNIT/ML injection Inject 5 Units into the skin 4 (four) times daily -  with meals and at bedtime.   Yes Historical Provider, MD  ipratropium-albuterol (DUONEB) 0.5-2.5 (3) MG/3ML SOLN Take 3 mLs by nebulization 5 (five) times daily as needed. For shortness of breath/wheezing   Yes Historical Provider, MD  levothyroxine (SYNTHROID, LEVOTHROID) 125 MCG tablet Take 125 mcg by mouth daily before breakfast.   Yes Historical Provider, MD  linaclotide (LINZESS) 145 MCG CAPS capsule Take 145 mcg by mouth at bedtime.   Yes Historical Provider, MD  mometasone (NASONEX) 50 MCG/ACT nasal spray Place 1 spray into the nose daily.    Yes Historical Provider, MD  montelukast (SINGULAIR) 10 MG tablet Take 10 mg by mouth at bedtime.   Yes Historical Provider, MD  morphine (MS CONTIN) 30 MG 12 hr tablet Take 30 mg by mouth every 12 (twelve) hours.   Yes Historical Provider, MD  nystatin (NYSTATIN) powder Apply topically 3 (three) times daily as needed (for yeast).   Yes Historical Provider, MD  ondansetron (ZOFRAN-ODT) 8 MG disintegrating tablet Take 8 mg by mouth every 8 (eight) hours as needed. For nausea/vomiting   Yes Historical Provider, MD  Oxycodone HCl 20 MG TABS Take 20 mg by mouth 3 (three) times daily.   Yes Historical Provider, MD  pramipexole (MIRAPEX) 0.5 MG tablet Take 0.5 mg by mouth at bedtime.   Yes Historical Provider, MD  sertraline (ZOLOFT) 100 MG tablet Take 100 mg by mouth at bedtime.   Yes Historical Provider, MD  SILENOR 6 MG TABS Take 6 mg by mouth daily. 01/23/17  Yes Historical Provider, MD  tiotropium (SPIRIVA) 18 MCG inhalation capsule Place 18 mcg into inhaler and inhale daily.   Yes Historical Provider, MD  topiramate (TOPAMAX) 100 MG tablet Take 100 mg by mouth at bedtime.   Yes Historical Provider, MD  traZODone (DESYREL) 100 MG tablet Take 200 mg by mouth at bedtime.   Yes Historical Provider, MD  TRESIBA FLEXTOUCH 200 UNIT/ML SOPN  Inject 25 Units into the skin 2 (two) times daily. 12/03/16  Yes Historical Provider, MD  Vitamin D, Ergocalciferol, (DRISDOL) 50000 units CAPS capsule Take 50,000 Units by mouth every 7 (seven) days.   Yes Historical Provider, MD  vitamin E 400 UNIT capsule Take 400 Units by mouth daily.   Yes Historical Provider, MD  cephALEXin (KEFLEX) 500 MG capsule Take 1 capsule (500 mg total) by mouth 2 (two) times daily. Patient not taking: Reported on 01/28/2017 09/02/16   Marlon Pel, PA-C    Family History History reviewed. No pertinent family history.  Social History Social History  Substance Use Topics  .  Smoking status: Former Smoker    Quit date: 10/21/1995  . Smokeless tobacco: Never Used  . Alcohol use Not on file     Allergies   Ciprofloxacin; Pregabalin; Aspirin; Cephalexin; Latex; and Macrodantin [nitrofurantoin macrocrystal]   Review of Systems Review of Systems  Constitutional: Negative for fever.  Respiratory: Positive for shortness of breath. Negative for cough.   Cardiovascular: Negative for chest pain.  Gastrointestinal: Negative for abdominal pain, diarrhea and vomiting.  Genitourinary: Positive for dysuria and urgency.  Neurological: Positive for syncope. Negative for weakness and numbness.  All other systems reviewed and are negative.    Physical Exam Updated Vital Signs BP 122/65 (BP Location: Right Arm)   Pulse (!) 105   Temp 98.5 F (36.9 C) (Oral)   Resp 16   SpO2 98%   Physical Exam  Constitutional: She is oriented to person, place, and time. She appears well-developed and well-nourished. No distress.  HENT:  Head: Normocephalic and atraumatic.  Eyes: Pupils are equal, round, and reactive to light.  Cardiovascular: Regular rhythm and normal heart sounds.   Tachycardia  Pulmonary/Chest: Effort normal. No respiratory distress. She has no wheezes.  Diminished breath sounds in all lung fields  Abdominal: Soft. Bowel sounds are normal. There is no  tenderness. There is no guarding.  Musculoskeletal:  Trace bilateral lower extremity edema  Neurological: She is alert and oriented to person, place, and time.  Cranial nerves II through XII intact, 5 out of 5 strength in all 4 extremities, no dysmetria to finger-nose-finger  Skin: Skin is warm and dry.  Psychiatric: She has a normal mood and affect.  Nursing note and vitals reviewed.    ED Treatments / Results  Labs (all labs ordered are listed, but only abnormal results are displayed) Labs Reviewed  BASIC METABOLIC PANEL - Abnormal; Notable for the following:       Result Value   Potassium 3.4 (*)    Glucose, Bld 162 (*)    Creatinine, Ser 1.37 (*)    GFR calc non Af Amer 41 (*)    GFR calc Af Amer 47 (*)    All other components within normal limits  CBC - Abnormal; Notable for the following:    Hemoglobin 10.5 (*)    HCT 33.8 (*)    MCH 24.9 (*)    RDW 17.3 (*)    All other components within normal limits  URINALYSIS, ROUTINE W REFLEX MICROSCOPIC - Abnormal; Notable for the following:    APPearance HAZY (*)    Leukocytes, UA LARGE (*)    Bacteria, UA MANY (*)    Squamous Epithelial / LPF 0-5 (*)    All other components within normal limits  URINE CULTURE  D-DIMER, QUANTITATIVE (NOT AT [facility])  I-STAT TROPOININ, ED    EKG  EKG Interpretation  Date/Time:  Tuesday January 27 2017 23:32:51 EDT Ventricular Rate:  112 PR Interval:  170 QRS Duration: 144 QT Interval:  348 QTC Calculation: 475 R Axis:   88 Text Interpretation:  Sinus tachycardia Right bundle branch block Abnormal ECG No significant change since last tracing Confirmed by Lazara Grieser  MD, Purva Vessell ([id number]) on 01/28/2017 3:52:34 AM       Radiology Dg Chest 2 View  Result Date: 01/28/2017 CLINICAL DATA:  Acute onset of shortness of breath. Syncope. Initial encounter. EXAM: CHEST  2 VIEW COMPARISON:  Chest radiograph performed 09/02/2016 FINDINGS: The lungs are well-aerated and clear. There is no evidence of  focal opacification, pleural effusion or pneumothorax. The  cardiomediastinal silhouette is within normal limits. No acute osseous abnormalities are seen. Clips are noted within the right upper quadrant, reflecting prior cholecystectomy. IMPRESSION: No acute cardiopulmonary process seen. Electronically Signed   By: Roanna Raider M.D.   On: 01/28/2017 05:24    Procedures Procedures (including critical care time)  Medications Ordered in ED Medications - No data to display   Initial Impression / Assessment and Plan / ED Course  I have reviewed the triage vital signs and the nursing notes.  Pertinent labs & imaging results that were available during my care of the patient were reviewed by me and considered in my medical decision making (see chart for details).     Patient presents with multiple complaints. She is nontoxic on exam. Mildly tachycardic but otherwise vital signs reassuring. Reports multiple episodes of loss of consciousness over the last several weeks. Sometimes with prodrome. Sometimes not. She is currently nonfocal. EKG without evidence of arrhythmia. She is tachycardic. Screening d-dimer sent. Troponin also added to workup. Lab work notable for creatinine of 1.37 with no prior for comparison. Also notably has a UTI. Urine culture sent and patient will be treated. If workup is reassuring, patient can likely be discharged home with close primary care follow-up.  Final Clinical Impressions(s) / ED Diagnoses   Final diagnoses:  None    New Prescriptions New Prescriptions   No medications on file     Shon Baton, MD 01/28/17 [phone]

## 2017-01-28 NOTE — ED Notes (Signed)
Patient transported to X-ray 

## 2017-01-28 NOTE — ED Provider Notes (Signed)
Patient signed out to me and Dr. Elesa Massed by Dr. Wilkie Aye.  Pt has had syncopal episodes over the past several weeks.  Workup initiated by Dr. Wilkie Aye.    Plan:  Follow-up on d-dimer, troponin, and orthostatics.  If negative, treat UTI and recommend PCP follow-up.  Patient is not orthostatic.  She has a negative d dimer.  Troponin pending.    Troponin is negative.  Patient is alert and oriented.    VSS.  Will treat UTI with bactrim.  Patient has follow-up with neurology today at 1pm.  Advised her that she keep this appointment. Additionally, patient may have the start to some cellulitis on her foot, bactrim should cover this.  Return precautions given.     Roxy Horseman, PA-C 01/28/17 [id number]    Shon Baton, MD 01/31/17 [phone]

## 2017-01-30 LAB — URINE CULTURE: Culture: >=100000 — AB

## 2017-01-31 ENCOUNTER — Telehealth: Payer: Self-pay

## 2017-01-31 NOTE — Telephone Encounter (Signed)
Post ED Visit - Positive Culture Follow-up: Unsuccessful Patient Follow-up  Culture assessed and recommendations reviewed by:  []  Enzo Bi, Pharm.D. []  Celedonio Miyamoto, Pharm.D., BCPS AQ-ID []  Garvin Fila, Pharm.D., BCPS []  Georgina Pillion, Pharm.D., BCPS []  [place], [address].D., BCPS, AAHIVP []  Estella Husk, Pharm.D., BCPS, AAHIVP []  Lysle Pearl, PharmD, BCPS [x]  [name], PharmD, BCPS []  Pollyann Samples, PharmD, BCPS  Positive urine culture  []  Patient discharged without antimicrobial prescription and treatment is now indicated [x]  Organism is resistant to prescribed ED discharge antimicrobial []  Patient with positive blood cultures   Unable to contact patient after 3 attempts, letter will be sent to address on file  Jerry Caras 01/31/2017, 10:29 AM

## 2017-01-31 NOTE — Progress Notes (Signed)
ED Antimicrobial Stewardship Positive Culture Follow Up   Margaret Lowe is an 61 y.o. female who presented to [facility] on 01/28/2017 with a chief complaint of  Chief Complaint  Patient presents with  . Leg Swelling  . Loss of Consciousness  . Urinary Tract Infection    Recent Results (from the past 720 hour(s))  Urine culture     Status: Abnormal   Collection Time: 01/27/17 11:55 PM  Result Value Ref Range Status   Specimen Description URINE, CLEAN CATCH  Final   Special Requests NONE  Final   Culture >=100,000 COLONIES/mL ESCHERICHIA COLI (A)  Final   Report Status 01/30/2017 FINAL  Final   Organism ID, Bacteria ESCHERICHIA COLI (A)  Final      Susceptibility   Escherichia coli - MIC*    AMPICILLIN >=32 RESISTANT Resistant     CEFAZOLIN <=4 SENSITIVE Sensitive     CEFTRIAXONE <=1 SENSITIVE Sensitive     CIPROFLOXACIN >=4 RESISTANT Resistant     GENTAMICIN >=16 RESISTANT Resistant     IMIPENEM <=0.25 SENSITIVE Sensitive     NITROFURANTOIN <=16 SENSITIVE Sensitive     TRIMETH/SULFA >=320 RESISTANT Resistant     AMPICILLIN/SULBACTAM >=32 RESISTANT Resistant     PIP/TAZO >=128 RESISTANT Resistant     Extended ESBL NEGATIVE Sensitive     * >=100,000 COLONIES/mL ESCHERICHIA COLI    [x]  Treated with sulfamethoxazole-trimethoprim, organism resistant to prescribed antimicrobial  New antibiotic prescription: Stop sulfamethoxazole-trimethoprim. Start fosfomycin 3 gm PO x 1 dose.  ED Provider: Jaynie Crumble, PA-C    [name], PharmD, BCPS PGY-2 Infectious Diseases Pharmacy Resident Pager: [phone] 01/31/2017, 8:57 AM

## 2017-02-05 ENCOUNTER — Telehealth: Payer: Self-pay | Admitting: *Deleted

## 2017-02-05 NOTE — Telephone Encounter (Signed)
Post ED Visit - Positive Culture Follow-up: Successful Patient Follow-Up  Culture assessed and recommendations reviewed by: []  Enzo Bi, Pharm.D. []  Celedonio Miyamoto, [address].D., BCPS AQ-ID []  Garvin Fila, Pharm.D., BCPS []  Georgina Pillion, Pharm.D., BCPS []  [place], [place].D., BCPS, AAHIVP []  Estella Husk, Pharm.D., BCPS, AAHIVP []  Lysle Pearl, PharmD, BCPS []  [name], PharmD, BCPS []  Pollyann Samples, PharmD, BCPS  Positiveurine culture  []  Patient discharged without antimicrobial prescription and treatment is now indicated [x]  Organism is resistant to prescribed ED discharge antimicrobial []  Patient with positive blood cultures  Changes discussed with ED provider Jaynie Crumble, PA-C New antibiotic prescription Fosfomycin 3gm PO x 1 Called to [facility], [address] [place], [phone]  Contacted patient, date 02/05/2017, time 1415   Lysle Pearl 02/05/2017, 2:14 PM

## 2017-11-25 DIAGNOSIS — M150 Primary generalized (osteo)arthritis: Secondary | ICD-10-CM | POA: Diagnosis not present

## 2019-07-06 ENCOUNTER — Other Ambulatory Visit (HOSPITAL_COMMUNITY): Payer: Self-pay | Admitting: Nephrology

## 2019-07-06 ENCOUNTER — Other Ambulatory Visit: Payer: Self-pay | Admitting: Nephrology

## 2019-07-06 DIAGNOSIS — N183 Chronic kidney disease, stage 3 unspecified: Secondary | ICD-10-CM

## 2019-07-12 ENCOUNTER — Ambulatory Visit (HOSPITAL_COMMUNITY): Payer: Medicare Other

## 2019-07-12 ENCOUNTER — Encounter (HOSPITAL_COMMUNITY): Payer: Self-pay

## 2019-09-19 ENCOUNTER — Emergency Department (HOSPITAL_COMMUNITY)
Admission: EM | Admit: 2019-09-19 | Discharge: 2019-09-19 | Disposition: A | Discharge status: Home / Self Care | Payer: Medicare Other | Attending: Emergency Medicine | Admitting: Emergency Medicine

## 2019-09-19 ENCOUNTER — Emergency Department (HOSPITAL_COMMUNITY): Payer: Medicare Other

## 2019-09-19 ENCOUNTER — Other Ambulatory Visit: Payer: Self-pay

## 2019-09-19 DIAGNOSIS — Z91040 Latex allergy status: Secondary | ICD-10-CM | POA: Insufficient documentation

## 2019-09-19 DIAGNOSIS — R55 Syncope and collapse: Secondary | ICD-10-CM | POA: Diagnosis not present

## 2019-09-19 DIAGNOSIS — R05 Cough: Secondary | ICD-10-CM | POA: Insufficient documentation

## 2019-09-19 DIAGNOSIS — Z87891 Personal history of nicotine dependence: Secondary | ICD-10-CM | POA: Diagnosis not present

## 2019-09-19 DIAGNOSIS — Z79899 Other long term (current) drug therapy: Secondary | ICD-10-CM | POA: Diagnosis not present

## 2019-09-19 DIAGNOSIS — E039 Hypothyroidism, unspecified: Secondary | ICD-10-CM | POA: Diagnosis not present

## 2019-09-19 DIAGNOSIS — R0789 Other chest pain: Secondary | ICD-10-CM | POA: Insufficient documentation

## 2019-09-19 LAB — URINALYSIS, ROUTINE W REFLEX MICROSCOPIC
Bilirubin Urine: NEGATIVE
Glucose, UA: NEGATIVE mg/dL
Hgb urine dipstick: NEGATIVE
Ketones, ur: NEGATIVE mg/dL
Nitrite: NEGATIVE
Protein, ur: 30 mg/dL — AB
Specific Gravity, Urine: 1.013 (ref 1.005–1.030)
pH: 5 (ref 5.0–8.0)

## 2019-09-19 LAB — CBC
HCT: 42.7 % (ref 36.0–46.0)
Hemoglobin: 13.4 g/dL (ref 12.0–15.0)
MCH: 27.6 pg (ref 26.0–34.0)
MCHC: 31.4 g/dL (ref 30.0–36.0)
MCV: 87.9 fL (ref 80.0–100.0)
Platelets: 263 10*3/uL (ref 150–400)
RBC: 4.86 MIL/uL (ref 3.87–5.11)
RDW: 14.6 % (ref 11.5–15.5)
WBC: 8.9 10*3/uL (ref 4.0–10.5)
nRBC: 0 % (ref 0.0–0.2)

## 2019-09-19 LAB — BASIC METABOLIC PANEL
Anion gap: 9 (ref 5–15)
BUN: 16 mg/dL (ref 8–23)
CO2: 23 mmol/L (ref 22–32)
Calcium: 9.5 mg/dL (ref 8.9–10.3)
Chloride: 106 mmol/L (ref 98–111)
Creatinine, Ser: 0.92 mg/dL (ref 0.44–1.00)
GFR calc Af Amer: >60 mL/min (ref >60)
GFR calc non Af Amer: >60 mL/min (ref >60)
Glucose, Bld: 141 mg/dL — ABNORMAL HIGH (ref 70–99)
Potassium: 4.5 mmol/L (ref 3.5–5.1)
Sodium: 138 mmol/L (ref 135–145)

## 2019-09-19 LAB — TROPONIN I (HIGH SENSITIVITY)
Troponin I (High Sensitivity): 6 ng/L (ref <18)
Troponin I (High Sensitivity): 6 ng/L (ref <18)

## 2019-09-19 LAB — CBG MONITORING, ED: Glucose-Capillary: 123 mg/dL — ABNORMAL HIGH (ref 70–99)

## 2019-09-19 MED ORDER — ACETAMINOPHEN 325 MG PO TABS: 650.0000 mg | ORAL_TABLET | Freq: Four times a day (QID) | ORAL | Status: DC | PRN

## 2019-09-19 MED ORDER — SODIUM CHLORIDE 0.9% FLUSH
3.0000 mL | Freq: Once | INTRAVENOUS | Status: AC
  Administered 2019-09-19: 18:00:00 3 mL via INTRAVENOUS

## 2019-09-19 MED ORDER — IOHEXOL 350 MG/ML SOLN
75.0000 mL | Freq: Once | INTRAVENOUS | Status: AC | PRN
  Administered 2019-09-19: 18:00:00 75 mL via INTRAVENOUS

## 2019-09-19 NOTE — ED Notes (Signed)
PA informed new onset pain of right lower quadrant. Pt states ongoing pain for several weeks that comes and goes. Tenderness on palpation rates pain 9 out of 10. Pain radiating down right leg.

## 2019-09-19 NOTE — ED Provider Notes (Addendum)
Pt's care assumed by me at 4pm from [facility].  Ct angio pending to  Evaluate for pe.  Pt has an area on her chest xray concerning for pneumonia.   Ct angio is normal.   I discussed results with pt. Pt complains of being allergic to all antibiotics.   Pt lives alone.  Pt states she has been lonely recently. Pt reports her family does not have anything to do with her. Pt reports she was being seen by [facility] but they no longer provide services to her  Care management consulted and will set pt up with home therapy.  Fransico Meadow, PA-C 09/19/19 1954    Sidney Ace 09/19/19 [id number]    Gareth Morgan, MD 09/19/19 [phone]

## 2019-09-19 NOTE — ED Notes (Signed)
Patient transported to CT 

## 2019-09-19 NOTE — Discharge Instructions (Signed)
Return if any problems.  See your Physician for recheck next week.  You have a urine culture and covid test pending

## 2019-09-19 NOTE — ED Triage Notes (Signed)
Pt arrives via EMS from [place] office where patient was being seen for followup. Pt had multiple complaints today including chest pain and cough for the last 2 weeks. Pt also had syncopal event this morning. Wears O2 at baseline 4L. Pt alert, oriented x4. Appears lethargic in triage. Pt Vss.

## 2019-09-19 NOTE — ED Notes (Signed)
Discharge instructions discussed with pt. Pt to follow up with PCP.   Pt is normal 4 L at home. Sister from [place] to coming to pick her but didn't not pick her oxygen tank from [place]. Pt given O2 tank to return to ED

## 2019-09-19 NOTE — ED Provider Notes (Signed)
[facility] EMERGENCY DEPARTMENT Provider Note   CSN: [id number] Arrival date & time: 09/19/19  1306     History   Chief Complaint Chief Complaint  Patient presents with  . Cough  . Chest Pain    HPI Margaret Lowe is a 63 y.o. female.     HPI Patient presents to the emergency department with chest discomfort and cough over the last 2 weeks.  The patient states she has had several syncopal episodes over the last few weeks but she states that she has been having these for several years and they are not quite sure what is causing them.  She did have a syncopal episode this morning.  The patient states she was seen by her primary doctor who was concerned and sent her here for further evaluation.  The patient states that she is on 4 L of home oxygen.  The patient states that nothing seems to make her condition better or worse.  The patient denies  shortness of breath, headache,blurred vision, neck pain, fever, weakness, numbness, dizziness, anorexia, edema, abdominal pain, nausea, vomiting, diarrhea, rash, back pain, dysuria, hematemesis, bloody stool, near syncope, or syncope. Past Medical History:  Diagnosis Date  . Allergic rhinitis   . Chronic rhinitis   . Crohn disease (HCC)   . Dyspnea   . Esophageal reflux   . Extrinsic asthma   . Fatigue   . Fibromyalgia   . H/O total hysterectomy   . Hypothyroidism   . Major depression   . Neuropathy   . Sleep apnea with hypersomnolence   . Stroke (HCC)   . Vitamin D deficiency     There are no active problems to display for this patient.   Past Surgical History:  Procedure Laterality Date  . CHOLECYSTECTOMY    . RIGHT HEART CATHETERIZATION N/A 10/06/2012   Procedure: RIGHT HEART CATH;  Surgeon: Dolores Patty, MD;  Location: [facility] CATH LAB;  Service: Cardiovascular;  Laterality: N/A;     OB History   No obstetric history on file.      Home Medications    Prior to Admission medications   Medication  Sig Start Date End Date Taking? Authorizing Provider  acetaminophen (TYLENOL) 500 MG tablet Take 500 mg by mouth every 6 (six) hours as needed for mild pain.    [provider]  albuterol (PROVENTIL HFA) 108 (90 BASE) MCG/ACT inhaler Inhale 2 puffs into the lungs every 6 (six) hours as needed. For shortness of breath/wheezing    [provider]  albuterol (PROVENTIL) (2.5 MG/3ML) 0.083% nebulizer solution Take 2.5 mg by nebulization every 6 (six) hours as needed for wheezing or shortness of breath.    [provider]  ALPRAZolam Prudy Feeler) 1 MG tablet Take 1 mg by mouth 2 (two) times daily.     [provider]  apixaban (ELIQUIS) 5 MG TABS tablet Take 5 mg by mouth 2 (two) times daily.    [provider]  baclofen (LIORESAL) 20 MG tablet Take 20 mg by mouth 3 (three) times daily.    [provider]  buPROPion (WELLBUTRIN XL) 300 MG 24 hr tablet Take 300 mg by mouth daily. 12/03/16   [provider]  cephALEXin (KEFLEX) 500 MG capsule Take 1 capsule (500 mg total) by mouth 2 (two) times daily. Patient not taking: Reported on 01/28/2017 09/02/16   Marlon Pel, PA-C  Cyanocobalamin (VITAMIN B-12 IJ) Inject 1 mL as directed every 30 (thirty) days.  [provider]  cyclobenzaprine (FLEXERIL) 10 MG tablet Take 10 mg by mouth 3 (three) times daily. For muscle spasms    [provider]  docusate sodium (COLACE) 100 MG capsule Take 200 mg by mouth daily.    [provider]  esomeprazole (NEXIUM) 40 MG capsule Take 40 mg by mouth 2 (two) times daily.    [provider]  fenofibrate (TRICOR) 145 MG tablet Take 145 mg by mouth at bedtime.    [provider]  Flaxseed, Linseed, (FLAX SEED OIL PO) Take 1 tablet by mouth daily.    [provider]  fluPHENAZine (PROLIXIN) 1 MG tablet Take 1 mg by mouth daily as needed.    [provider]  folic acid (FOLVITE) 1 MG tablet Take 2-3 mg by  mouth See admin instructions. Take 3 tablets at breakfast then take 2 tablets at lunch    [provider]  furosemide (LASIX) 20 MG tablet Take 20 mg by mouth every Monday, Wednesday, and Friday.     [provider]  insulin aspart (NOVOLOG) 100 UNIT/ML injection Inject 5 Units into the skin 4 (four) times daily -  with meals and at bedtime.    [provider]  ipratropium-albuterol (DUONEB) 0.5-2.5 (3) MG/3ML SOLN Take 3 mLs by nebulization 5 (five) times daily as needed. For shortness of breath/wheezing    [provider]  levothyroxine (SYNTHROID, LEVOTHROID) 125 MCG tablet Take 125 mcg by mouth daily before breakfast.    [provider]  linaclotide (LINZESS) 145 MCG CAPS capsule Take 145 mcg by mouth at bedtime.    [provider]  mometasone (NASONEX) 50 MCG/ACT nasal spray Place 1 spray into the nose daily.     [provider]  montelukast (SINGULAIR) 10 MG tablet Take 10 mg by mouth at bedtime.    [provider]  morphine (MS CONTIN) 30 MG 12 hr tablet Take 30 mg by mouth every 12 (twelve) hours.    [provider]  nystatin (NYSTATIN) powder Apply topically 3 (three) times daily as needed (for yeast).    [provider]  ondansetron (ZOFRAN-ODT) 8 MG disintegrating tablet Take 8 mg by mouth every 8 (eight) hours as needed. For nausea/vomiting    [provider]  Oxycodone HCl 20 MG TABS Take 20 mg by mouth 3 (three) times daily.    [provider]  pramipexole (MIRAPEX) 0.5 MG tablet Take 0.5 mg by mouth at bedtime.    [provider]  sertraline (ZOLOFT) 100 MG tablet Take 100 mg by mouth at bedtime.    [provider]  SILENOR 6 MG TABS Take 6 mg by mouth daily. 01/23/17   [provider]  tiotropium (SPIRIVA) 18 MCG inhalation capsule Place 18 mcg into inhaler and inhale daily.    [provider]  topiramate (TOPAMAX) 100 MG tablet Take 100 mg by  mouth at bedtime.    [provider]  traZODone (DESYREL) 100 MG tablet Take 200 mg by mouth at bedtime.    [provider]  TRESIBA FLEXTOUCH 200 UNIT/ML SOPN Inject 25 Units into the skin 2 (two) times daily. 12/03/16   [provider]  Vitamin D, Ergocalciferol, (DRISDOL) 50000 units CAPS capsule Take 50,000 Units by mouth every 7 (seven) days.    [provider]  vitamin E 400 UNIT capsule Take 400 Units by mouth daily.    [provider]    Family History No family history on file.  Social History Social History   Tobacco Use  . Smoking status: Former Smoker    Quit date: 10/21/1995    Years since quitting: 23.9  . Smokeless tobacco: Never Used  Substance Use Topics  . Alcohol use: Not on file  . Drug use: No     Allergies   Ciprofloxacin, Pregabalin, Aspirin, Cephalexin, Latex, and Macrodantin [nitrofurantoin macrocrystal]   Review of Systems Review of Systems  All other systems negative except as documented in the HPI. All pertinent positives and negatives as reviewed in the HPI. Physical Exam Updated Vital Signs BP (!) 178/95 (BP Location: Left Arm)   Pulse (!) 117   Temp 99.1 F (37.3 C) (Oral)   Resp 18   SpO2 100%   Physical Exam Vitals signs and nursing note reviewed.  Constitutional:      General: She is not in acute distress.    Appearance: She is well-developed.  HENT:     Head: Normocephalic and atraumatic.  Eyes:     Pupils: Pupils are equal, round, and reactive to light.  Neck:     Musculoskeletal: Normal range of motion and neck supple.  Cardiovascular:     Rate and Rhythm: Normal rate and regular rhythm.     Heart sounds: Normal heart sounds. No murmur. No friction rub. No gallop.   Pulmonary:     Effort: Pulmonary effort is normal. No respiratory distress.     Breath sounds: Normal breath sounds. No wheezing.  Abdominal:     General: Bowel sounds are normal. There is no distension.      Palpations: Abdomen is soft.     Tenderness: There is no abdominal tenderness.  Skin:    General: Skin is warm and dry.     Capillary Refill: Capillary refill takes less than 2 seconds.     Findings: No erythema or rash.  Neurological:     Mental Status: She is alert and oriented to person, place, and time.     Motor: No abnormal muscle tone.     Coordination: Coordination normal.  Psychiatric:        Behavior: Behavior normal.      ED Treatments / Results  Labs (all labs ordered are listed, but only abnormal results are displayed) Labs Reviewed  CBG MONITORING, ED - Abnormal; Notable for the following components:      Result Value   Glucose-Capillary 123 (*)    All other components within normal limits  BASIC METABOLIC PANEL  CBC  TROPONIN I (HIGH SENSITIVITY)  TROPONIN I (HIGH SENSITIVITY)    EKG None  Radiology Dg Chest 2 View  Result Date: 09/19/2019 CLINICAL DATA:  Cough.  Chest pain. EXAM: CHEST - 2 VIEW COMPARISON:  01/28/2017.  09/02/2016. FINDINGS: Mediastinum and hilar structures normal. New small ill-defined infiltrate left upper lung. This may represent a mild focus of pneumonia. Close follow-up chest x-rays recommended to demonstrate clearing. No pleural effusion or pneumothorax. Heart size normal. Degenerative change thoracic spine. IMPRESSION: New small ill-defined infiltrate left upper lung. This may represent a mild focus of pneumonia. Close follow-up chest x-rays recommended to demonstrate clearing. Electronically Signed   By: Maisie Fushomas  Register   On: 09/19/2019 13:51    Procedures Procedures (including critical care time)  Medications Ordered in ED Medications  sodium chloride flush (NS) 0.9 % injection 3 mL (has no administration in time range)     Initial Impression / Assessment and Plan / ED Course  I have reviewed the triage vital signs  and the nursing notes.  Pertinent labs & imaging results that were available during my care of the patient  were reviewed by me and considered in my medical decision making (see chart for details).     Patient will need CT scan imaging of her chest to further assess for any acute issue that is causing tachycardia and her chest discomfort.  Patient may need admission to the hospital due to her current symptoms and the syncopal episode.  Patient has been stable other than tachycardia and hypertension here in the emergency department.  Final Clinical Impressions(s) / ED Diagnoses   Final diagnoses:  None    ED Discharge Orders    None       Charlestine NightLawyer, Caci Orren, PA-C 09/19/19 1611    Benjiman CorePickering, Nathan, MD 09/20/19 [phone]

## 2019-09-21 LAB — URINE CULTURE: Culture: >=100000 — AB

## 2019-09-22 ENCOUNTER — Telehealth: Payer: Self-pay | Admitting: *Deleted

## 2019-09-22 NOTE — Progress Notes (Signed)
ED Antimicrobial Stewardship Positive Culture Follow Up   Margaret Lowe is an 63 y.o. female who presented to [facility] on 09/19/2019 with a chief complaint of  Chief Complaint  Patient presents with  . Cough  . Chest Pain   Presenting with chest discomfort and cough for 2 weeks. Also had syncopal episode. Negative for urinary symptoms but did complain for RLQ pain. WBC WNL, afebrile. UA trace leukocytes, neg nitrite, many bacteria, WBC 6-10.   Recent Results (from the past 720 hour(s))  Urine culture     Status: Abnormal   Collection Time: 09/19/19  8:16 PM   Specimen: Urine, Clean Catch  Result Value Ref Range Status   Specimen Description URINE, CLEAN CATCH  Final   Special Requests   Final    NONE Performed at [facility], [address], [place], [place] [address]    Culture >=100,000 COLONIES/mL ESCHERICHIA COLI (A)  Final   Report Status 09/21/2019 FINAL  Final   Organism ID, Bacteria ESCHERICHIA COLI (A)  Final      Susceptibility   Escherichia coli - MIC*    AMPICILLIN >=32 RESISTANT Resistant     CEFAZOLIN <=4 SENSITIVE Sensitive     CEFTRIAXONE <=1 SENSITIVE Sensitive     CIPROFLOXACIN >=4 RESISTANT Resistant     GENTAMICIN >=16 RESISTANT Resistant     IMIPENEM <=0.25 SENSITIVE Sensitive     NITROFURANTOIN <=16 SENSITIVE Sensitive     TRIMETH/SULFA >=320 RESISTANT Resistant     AMPICILLIN/SULBACTAM >=32 RESISTANT Resistant     PIP/TAZO >=128 RESISTANT Resistant     Extended ESBL NEGATIVE Sensitive     * >=100,000 COLONIES/mL ESCHERICHIA COLI    Plan: Call for symptom check; if symptoms, treat with cefdinir 300 mg twice daily for 5 days.  ED Provider: Kennith Maes, PA-C   Antonietta Jewel, PharmD, [place] Clinical Pharmacist  Phone: [phone]  Please check AMION for all [place] phone numbers After 10:00 PM, call [place] [phone] 09/22/2019, 9:27 AM Clinical Pharmacist Monday - Friday phone -  [phone] Saturday - Sunday phone -  [phone]

## 2019-09-22 NOTE — Telephone Encounter (Signed)
Post ED Visit - Positive Culture Follow-up: Unsuccessful Patient Follow-up  Culture assessed and recommendations reviewed by:  []  Elenor Quinones, Pharm.D. []  Heide Guile, Pharm.D., BCPS AQ-ID []  Parks Neptune, Pharm.D., BCPS []  Alycia Rossetti, Pharm.D., BCPS []  [place], Pharm.D., BCPS, AAHIVP []  Legrand Como, Pharm.D., BCPS, AAHIVP []  Wynell Balloon, PharmD []  Vincenza Hews, PharmD, BCPS  Positive urine culture  [x]  Patient discharged without antimicrobial prescription and treatment is now indicated []  Organism is resistant to prescribed ED discharge antimicrobial []  Patient with positive blood cultures  Plan, if symptomatic treat with Cefdinir 300mg  BID x 5 days, Kennith Maes, PA  Unable to contact patient after 3 attempts, letter will be sent to address on file  Ardeen Fillers 09/22/2019, 9:35 AM

## 2019-09-30 ENCOUNTER — Telehealth: Payer: Self-pay

## 2019-09-30 NOTE — Telephone Encounter (Signed)
Pt returned call from letter sent to home for symptom check for UC done ED 11/30.2020. Pt stated she still felt "pretty bad". Instructed some bacteria in urine and recommedation was for abx Cefdinir.  Pt states she is allergic to that and most all abx. Instructed to f/u with PCP of return to ED if she cannot take the abx after verifying Allergy list .

## 2019-10-06 ENCOUNTER — Ambulatory Visit: Payer: Medicare Other

## 2019-10-06 ENCOUNTER — Encounter: Payer: Self-pay | Admitting: Cardiology

## 2019-10-06 ENCOUNTER — Ambulatory Visit (INDEPENDENT_AMBULATORY_CARE_PROVIDER_SITE_OTHER): Payer: Medicare Other | Admitting: Cardiology

## 2019-10-06 ENCOUNTER — Other Ambulatory Visit: Payer: Self-pay

## 2019-10-06 DIAGNOSIS — J449 Chronic obstructive pulmonary disease, unspecified: Secondary | ICD-10-CM | POA: Insufficient documentation

## 2019-10-06 DIAGNOSIS — R079 Chest pain, unspecified: Secondary | ICD-10-CM

## 2019-10-06 DIAGNOSIS — J431 Panlobular emphysema: Secondary | ICD-10-CM

## 2019-10-06 DIAGNOSIS — R0789 Other chest pain: Secondary | ICD-10-CM

## 2019-10-06 DIAGNOSIS — Z9981 Dependence on supplemental oxygen: Secondary | ICD-10-CM | POA: Insufficient documentation

## 2019-10-06 DIAGNOSIS — R55 Syncope and collapse: Secondary | ICD-10-CM

## 2019-10-06 HISTORY — DX: Dependence on supplemental oxygen: Z99.81

## 2019-10-06 HISTORY — DX: Syncope and collapse: R55

## 2019-10-06 HISTORY — DX: Other chest pain: R07.89

## 2019-10-06 HISTORY — DX: Chronic obstructive pulmonary disease, unspecified: J44.9

## 2019-10-06 NOTE — Progress Notes (Signed)
Cardiology Office Note:    Date:  10/06/2019   ID:  CECILLIA BIEBER, DOB 1955/12/30, MRN [id number]  PCP:  [facility]  Cardiologist:  Garwin Brothers, MD   Referring MD: [facility]    ASSESSMENT:    1. Chest tightness   2. Panlobular emphysema (HCC)   3. Syncope and collapse   4. Supplemental oxygen dependent    PLAN:    In order of problems listed above:  1. Syncope: It is unclear why this lady is having syncopal attacks.  Cardiac etiology seems to be unlikely however we will do a monitoring for 2 weeks with ZIO monitoring.  I discussed this with her at extensive length.  Fall precautions were mentioned and she understands. 2. Cardiac murmur: Echocardiogram will be done to assess murmur heard on auscultation 3. In view of her chest tightness symptoms were very vague but in view of multiple risk factors she will have a Lexiscan sestamibi. 4. Patient will be seen in follow-up appointment in 6 months or earlier if the patient has any concerns.  She knows to go to the nearest emergency room for any concerning symptoms.   Medication Adjustments/Labs and Tests Ordered: Current medicines are reviewed at length with the patient today.  Concerns regarding medicines are outlined above.  No orders of the defined types were placed in this encounter.  No orders of the defined types were placed in this encounter.    History of Present Illness:    Margaret Lowe is a 63 y.o. female who is being seen today for the evaluation of syncope and chest tightness at the request of [facility].  Patient is a pleasant 63 year old female.  She has past medical history of COPD and is on oxygen.  She is coming here to see me because she has had multiple syncopal spells.  No orthopnea or PND.  She occasionally has chest tightness.  She mentions to me that her syncopal episodes happen several times a week.  At the time of my evaluation, the patient is alert awake oriented  and in no distress.  Past Medical History:  Diagnosis Date   Allergic rhinitis    Chronic rhinitis    Crohn disease (HCC)    Dyspnea    Esophageal reflux    Extrinsic asthma    Fatigue    Fibromyalgia    H/O total hysterectomy    Hypothyroidism    Major depression    Neuropathy    Sleep apnea with hypersomnolence    Stroke (HCC)    Vitamin D deficiency     Past Surgical History:  Procedure Laterality Date   CHOLECYSTECTOMY     RIGHT HEART CATHETERIZATION N/A 10/06/2012   Procedure: RIGHT HEART CATH;  Surgeon: Dolores Patty, MD;  Location: [facility] CATH LAB;  Service: Cardiovascular;  Laterality: N/A;    Current Medications: Current Meds  Medication Sig   acetaminophen (TYLENOL) 500 MG tablet Take 500 mg by mouth every 6 (six) hours as needed for mild pain.   albuterol (PROVENTIL HFA) 108 (90 BASE) MCG/ACT inhaler Inhale 2 puffs into the lungs every 6 (six) hours as needed. For shortness of breath/wheezing   albuterol (PROVENTIL) (2.5 MG/3ML) 0.083% nebulizer solution Take 2.5 mg by nebulization every 6 (six) hours as needed for wheezing or shortness of breath.   apixaban (ELIQUIS) 5 MG TABS tablet Take 5 mg by mouth 2 (two) times daily.   baclofen (LIORESAL) 20 MG tablet Take 10 mg  by mouth 2 (two) times daily.    buPROPion (WELLBUTRIN XL) 300 MG 24 hr tablet Take 300 mg by mouth daily.   Cyanocobalamin (VITAMIN B-12 IJ) Inject 1 mL as directed every 30 (thirty) days.   Flaxseed, Linseed, (FLAX SEED OIL PO) Take 1 tablet by mouth daily.   fluPHENAZine (PROLIXIN) 1 MG tablet Take 1 mg by mouth daily as needed.   folic acid (FOLVITE) 1 MG tablet Take 2-3 mg by mouth See admin instructions. Take 3 tablets at breakfast then take 2 tablets at lunch   insulin aspart (NOVOLOG) 100 UNIT/ML injection Inject 5 Units into the skin 4 (four) times daily -  with meals and at bedtime.   ipratropium-albuterol (DUONEB) 0.5-2.5 (3) MG/3ML SOLN Take 3 mLs by  nebulization 5 (five) times daily as needed. For shortness of breath/wheezing   levothyroxine (SYNTHROID, LEVOTHROID) 125 MCG tablet Take 125 mcg by mouth daily before breakfast.   mometasone (NASONEX) 50 MCG/ACT nasal spray Place 1 spray into the nose daily.    montelukast (SINGULAIR) 10 MG tablet Take 10 mg by mouth at bedtime.   nystatin (NYSTATIN) powder Apply topically 3 (three) times daily as needed (for yeast).   pramipexole (MIRAPEX) 0.5 MG tablet Take 0.5 mg by mouth at bedtime.   promethazine (PHENERGAN) 25 MG tablet Take 25 mg by mouth every 6 (six) hours as needed for nausea or vomiting.   TRESIBA FLEXTOUCH 200 UNIT/ML SOPN Inject 25 Units into the skin 2 (two) times daily.     Allergies:   Ciprofloxacin, Pregabalin, Aspirin, Cephalexin, Latex, and Macrodantin [nitrofurantoin macrocrystal]   Social History   Socioeconomic History   Marital status: Legally Separated    Spouse name: Not on file   Number of children: Not on file   Years of education: Not on file   Highest education level: Not on file  Occupational History   Not on file  Tobacco Use   Smoking status: Former Smoker    Quit date: 10/21/1995    Years since quitting: 23.9   Smokeless tobacco: Never Used  Substance and Sexual Activity   Alcohol use: Not Currently   Drug use: No   Sexual activity: Not on file  Other Topics Concern   Not on file  Social History Narrative   Not on file   Social Determinants of Health   Financial Resource Strain:    Difficulty of Paying Living Expenses: Not on file  Food Insecurity:    Worried About [place] in the Last Year: Not on file   [place] in the Last Year: Not on file  Transportation Needs:    Lack of Transportation (Medical): Not on file   Lack of Transportation (Non-Medical): Not on file  Physical Activity:    Days of Exercise per Week: Not on file   Minutes of Exercise per Session: Not on file  Stress:    Feeling  of Stress : Not on file  Social Connections:    Frequency of Communication with Friends and Family: Not on file   Frequency of Social Gatherings with Friends and Family: Not on file   Attends Religious Services: Not on file   Active Member of Clubs or Organizations: Not on file   Attends Archivist Meetings: Not on file   Marital Status: Not on file     Family History: The patient's family history includes Dementia in her father; Heart attack in her brother, brother, and mother.  ROS:  Please see the history of present illness.    All other systems reviewed and are negative.  EKGs/Labs/Other Studies Reviewed:    The following studies were reviewed today: EKG reveals sinus tachycardia and nonspecific ST-T changes   Recent Labs: 09/19/2019: BUN 16; Creatinine, Ser 0.92; Hemoglobin 13.4; Platelets 263; Potassium 4.5; Sodium 138  Recent Lipid Panel No results found for: CHOL, TRIG, HDL, CHOLHDL, VLDL, LDLCALC, LDLDIRECT  Physical Exam:    VS:  BP (!) 178/82 (BP Location: Left Arm)    Pulse (!) 109    Ht 5' 5.5" (1.664 m)    Wt 165 lb (74.8 kg)    BMI 27.04 kg/m     Wt Readings from Last 3 Encounters:  10/06/19 165 lb (74.8 kg)  09/19/19 166 lb (75.3 kg)  09/01/16 180 lb (81.6 kg)     GEN: Patient is in no acute distress HEENT: Normal NECK: No JVD; No carotid bruits LYMPHATICS: No lymphadenopathy CARDIAC: S1 S2 regular, 2/6 systolic murmur at the apex. RESPIRATORY:  Clear to auscultation without rales, wheezing or rhonchi.  Lung sounds are distant. ABDOMEN: Soft, non-tender, non-distended MUSCULOSKELETAL:  No edema; No deformity  SKIN: Warm and dry NEUROLOGIC:  Alert and oriented x 3 PSYCHIATRIC:  Normal affect    Signed, Garwin Brothers, MD  10/06/2019 10:18 AM    [facility]

## 2019-10-06 NOTE — Patient Instructions (Signed)
Medication Instructions:  Your physician recommends that you continue on your current medications as directed. Please refer to the Current Medication list given to you today.  *If you need a refill on your cardiac medications before your next appointment, please call your pharmacy*  Lab Work: None Ordered  Testing/Procedures: Your physician has requested that you have a lexiscan myoview. For further information please visit [email or web address] Please follow instruction sheet, as given.  Your physician has requested that you have an echocardiogram. Echocardiography is a painless test that uses sound waves to create images of your heart. It provides your doctor with information about the size and shape of your heart and how well your heart's chambers and valves are working. This procedure takes approximately one hour. There are no restrictions for this procedure.  Your physician has recommended that you wear an event monitor. Event monitors are medical devices that record the heart's electrical activity. Doctors most often [place] these monitors to diagnose arrhythmias. Arrhythmias are problems with the speed or rhythm of the heartbeat. The monitor is a small, portable device. You can wear one while you do your normal daily activities. This is usually used to diagnose what is causing palpitations/syncope (passing out).    Follow-Up: At [facility], you and your health needs are our priority.  As part of our continuing mission to provide you with exceptional heart care, we have created designated Provider Care Teams.  These Care Teams include your primary Cardiologist (physician) and Advanced Practice Providers (APPs -  Physician Assistants and Nurse Practitioners) who all work together to provide you with the care you need, when you need it.  Your next appointment:   3 month(s)  The format for your next appointment:   In Person  Provider:   Belva Crome, MD  Other  Instructions   Echocardiogram An echocardiogram is a procedure that uses painless sound waves (ultrasound) to produce an image of the heart. Images from an echocardiogram can provide important information about:  Signs of coronary artery disease (CAD).  Aneurysm detection. An aneurysm is a weak or damaged part of an artery wall that bulges out from the normal force of blood pumping through the body.  Heart size and shape. Changes in the size or shape of the heart can be associated with certain conditions, including heart failure, aneurysm, and CAD.  Heart muscle function.  Heart valve function.  Signs of a past heart attack.  Fluid buildup around the heart.  Thickening of the heart muscle.  A tumor or infectious growth around the heart valves. Tell a health care provider about:  Any allergies you have.  All medicines you are taking, including vitamins, herbs, eye drops, creams, and over-the-counter medicines.  Any blood disorders you have.  Any surgeries you have had.  Any medical conditions you have.  Whether you are pregnant or may be pregnant. What are the risks? Generally, this is a safe procedure. However, problems may occur, including:  Allergic reaction to dye (contrast) that may be used during the procedure. What happens before the procedure? No specific preparation is needed. You may eat and drink normally. What happens during the procedure?   An IV tube may be inserted into one of your veins.  You may receive contrast through this tube. A contrast is an injection that improves the quality of the pictures from your heart.  A gel will be applied to your chest.  A wand-like tool (transducer) will be moved over your chest. The gel will help  to transmit the sound waves from the transducer.  The sound waves will harmlessly bounce off of your heart to allow the heart images to be captured in real-time motion. The images will be recorded on a computer. The  procedure may vary among health care providers and hospitals. What happens after the procedure?  You may return to your normal, everyday life, including diet, activities, and medicines, unless your health care provider tells you not to do that. Summary  An echocardiogram is a procedure that uses painless sound waves (ultrasound) to produce an image of the heart.  Images from an echocardiogram can provide important information about the size and shape of your heart, heart muscle function, heart valve function, and fluid buildup around your heart.  You do not need to do anything to prepare before this procedure. You may eat and drink normally.  After the echocardiogram is completed, you may return to your normal, everyday life, unless your health care provider tells you not to do that. This information is not intended to replace advice given to you by your health care provider. Make sure you discuss any questions you have with your health care provider. Document Released: 10/03/2000 Document Revised: 01/27/2019 Document Reviewed: 11/08/2016 Elsevier Patient Education  2020 Elsevier Inc.   Ambulatory Cardiac Monitoring An ambulatory cardiac monitor is a small recording device that is used to detect abnormal heart rhythms (arrhythmias). Most monitors are connected by wires to flat, sticky disks (electrodes) that are then attached to your chest. You may need to wear a monitor if you have had symptoms such as:  Fast heartbeats (palpitations).  Dizziness.  Fainting or light-headedness.  Unexplained weakness.  Shortness of breath. There are several types of monitors. Some common monitors include:  Holter monitor. This records your heart rhythm continuously, usually for 24-48 hours.  Event (episodic) monitor. This monitor has a symptoms button, and when pushed, it will begin recording. You need to activate this monitor to record when you have a heart-related symptom.  Automatic detection  monitor. This monitor will begin recording when it detects an abnormal heartbeat. What are the risks? Generally, these devices are safe to use. However, it is possible that the skin under the electrodes will become irritated. How to prepare for monitoring Your health care provider will prepare your chest for the electrode placement and show you how to use the monitor.  Do not apply lotions to your chest before monitoring.  Follow directions on how to care for the monitor, and how to return the monitor when the testing period is complete. How to use your cardiac monitor  Follow directions about how long to wear the monitor, and if you can take the monitor off in order to shower or bathe. ? Do not let the monitor get wet. ? Do not bathe, swim, or use a hot tub while wearing the monitor.  Keep your skin clean. Do not put body lotion or moisturizer on your chest.  Change the electrodes as told by your health care provider, or any time they stop sticking to your skin. You may need to use medical tape to keep them on.  Try to put the electrodes in slightly different places on your chest to help prevent skin irritation. Follow directions from your health care provider about where to place the electrodes.  Make sure the monitor is safely clipped to your clothing or in a location close to your body as recommended by your health care provider.  If your monitor has  a symptoms button, press the button to mark an event as soon as you feel a heart-related symptom, such as: ? Dizziness. ? Weakness. ? Light-headedness. ? Palpitations. ? Thumping or pounding in your chest. ? Shortness of breath. ? Unexplained weakness.  Keep a diary of your activities, such as walking, doing chores, and taking medicine. It is very important to note what you were doing when you pushed the button to record your symptoms. This will help your health care provider determine what might be contributing to your  symptoms.  Send the recorded information as recommended by your health care provider. It may take some time for your health care provider to process the results.  Change the batteries as told by your health care provider.  Keep electronic devices away from your monitor. These include: ? Tablets. ? MP3 players. ? Cell phones.  While wearing your monitor you should avoid: ? Electric blankets. ? Firefighter. ? Electric toothbrushes. ? Microwave ovens. ? Magnets. ? Metal detectors. Get help right away if:  You have chest pain.  You have shortness of breath or extreme difficulty breathing.  You develop a very fast heartbeat that does not get better.  You develop dizziness that does not go away.  You faint or constantly feel like you are about to faint. Summary  An ambulatory cardiac monitor is a small recording device that is used to detect abnormal heart rhythms (arrhythmias).  Make sure you understand how to send the information from the monitor to your health care provider.  It is important to press the button on the monitor when you have any heart-related symptoms.  Keep a diary of your activities, such as walking, doing chores, and taking medicine. It is very important to note what you were doing when you pushed the button to record your symptoms. This will help your health care provider learn what might be causing your symptoms. This information is not intended to replace advice given to you by your health care provider. Make sure you discuss any questions you have with your health care provider. Document Released: 07/15/2008 Document Revised: 09/18/2017 Document Reviewed: 09/20/2016 Elsevier Patient Education  2020 Elsevier Inc.   Cardiac Nuclear Scan A cardiac nuclear scan is a test that measures blood flow to the heart when a person is resting and when he or she is exercising. The test looks for problems such as:  Not enough blood reaching a portion of the  heart.  The heart muscle not working normally. You may need this test if:  You have heart disease.  You have had abnormal lab results.  You have had heart surgery or a balloon procedure to open up blocked arteries (angioplasty).  You have chest pain.  You have shortness of breath. In this test, a radioactive dye (tracer) is injected into your bloodstream. After the tracer has traveled to your heart, an imaging device is used to measure how much of the tracer is absorbed by or distributed to various areas of your heart. This procedure is usually done at a hospital and takes 2-4 hours. Tell a health care provider about:  Any allergies you have.  All medicines you are taking, including vitamins, herbs, eye drops, creams, and over-the-counter medicines.  Any problems you or family members have had with anesthetic medicines.  Any blood disorders you have.  Any surgeries you have had.  Any medical conditions you have.  Whether you are pregnant or may be pregnant. What are the risks? Generally, this  is a safe procedure. However, problems may occur, including:  Serious chest pain and heart attack. This is only a risk if the stress portion of the test is done.  Rapid heartbeat.  Sensation of warmth in your chest. This usually passes quickly.  Allergic reaction to the tracer. What happens before the procedure?  Ask your health care provider about changing or stopping your regular medicines. This is especially important if you are taking diabetes medicines or blood thinners.  Follow instructions from your health care provider about eating or drinking restrictions.  Remove your jewelry on the day of the procedure. What happens during the procedure?  An IV will be inserted into one of your veins.  Your health care provider will inject a small amount of radioactive tracer through the IV.  You will wait for 20-40 minutes while the tracer travels through your bloodstream.  Your  heart activity will be monitored with an electrocardiogram (ECG).  You will lie down on an exam table.  Images of your heart will be taken for about 15-20 minutes.  You may also have a stress test. For this test, one of the following may be done: ? You will exercise on a treadmill or stationary bike. While you exercise, your heart's activity will be monitored with an ECG, and your blood pressure will be checked. ? You will be given medicines that will increase blood flow to parts of your heart. This is done if you are unable to exercise.  When blood flow to your heart has peaked, a tracer will again be injected through the IV.  After 20-40 minutes, you will get back on the exam table and have more images taken of your heart.  Depending on the type of tracer used, scans may need to be repeated 3-4 hours later.  Your IV line will be removed when the procedure is over. The procedure may vary among health care providers and hospitals. What happens after the procedure?  Unless your health care provider tells you otherwise, you may return to your normal schedule, including diet, activities, and medicines.  Unless your health care provider tells you otherwise, you may increase your fluid intake. This will help to flush the contrast dye from your body. Drink enough fluid to keep your urine pale yellow.  Ask your health care provider, or the department that is doing the test: ? When will my results be ready? ? How will I get my results? Summary  A cardiac nuclear scan measures the blood flow to the heart when a person is resting and when he or she is exercising.  Tell your health care provider if you are pregnant.  Before the procedure, ask your health care provider about changing or stopping your regular medicines. This is especially important if you are taking diabetes medicines or blood thinners.  After the procedure, unless your health care provider tells you otherwise, increase your  fluid intake. This will help flush the contrast dye from your body.  After the procedure, unless your health care provider tells you otherwise, you may return to your normal schedule, including diet, activities, and medicines. This information is not intended to replace advice given to you by your health care provider. Make sure you discuss any questions you have with your health care provider. Document Released: 10/31/2004 Document Revised: 03/22/2018 Document Reviewed: 03/22/2018 Elsevier Patient Education  2020 [employer].

## 2019-10-17 DIAGNOSIS — R55 Syncope and collapse: Secondary | ICD-10-CM

## 2019-10-27 ENCOUNTER — Telehealth: Payer: Self-pay

## 2019-10-27 NOTE — Telephone Encounter (Signed)
Left message to call office concerning abn lexi results. Dr. Janey Genta would like to ensure that patient keeps f/u appt to review.

## 2019-10-28 NOTE — Telephone Encounter (Signed)
Information relayed, patient f/u appt moved to 11/04/19.

## 2019-11-04 ENCOUNTER — Ambulatory Visit (INDEPENDENT_AMBULATORY_CARE_PROVIDER_SITE_OTHER): Payer: Medicare Other | Admitting: Cardiology

## 2019-11-04 ENCOUNTER — Other Ambulatory Visit: Payer: Self-pay

## 2019-11-04 ENCOUNTER — Encounter: Payer: Self-pay | Admitting: Cardiology

## 2019-11-04 VITALS — BP 146/80 | HR 105 | Ht 65.5 in | Wt 174.0 lb

## 2019-11-04 DIAGNOSIS — R0789 Other chest pain: Secondary | ICD-10-CM

## 2019-11-04 DIAGNOSIS — Z9981 Dependence on supplemental oxygen: Secondary | ICD-10-CM

## 2019-11-04 DIAGNOSIS — J431 Panlobular emphysema: Secondary | ICD-10-CM

## 2019-11-04 DIAGNOSIS — R55 Syncope and collapse: Secondary | ICD-10-CM

## 2019-11-04 MED ORDER — ISOSORBIDE MONONITRATE ER 60 MG PO TB24: 60.0000 mg | ORAL_TABLET | Freq: Every day | ORAL | 1 refills | Status: DC

## 2019-11-04 NOTE — Progress Notes (Signed)
Cardiology Office Note:    Date:  11/04/2019   ID:  Margaret Lowe, DOB [birth year], MRN [id number]  PCP:  [facility]  Cardiologist:  Jenean Lindau, MD   Referring MD: [facility]    ASSESSMENT:    1. Chest tightness   2. Panlobular emphysema ([place])   3. Supplemental oxygen dependent   4. Syncope and collapse    PLAN:    In order of problems listed above:  1. Mildly abnormal nuclear stress test: I discussed my findings with the patient at extensive length.  She vocalized understanding.  Modalities of further evaluation with invasive approach was discussed and she prefers medical management in view of multiple comorbidities.  I discussed this with her at extensive length and questions were answered to her satisfaction.  I respect her wishes.  In this regard I will initiate her on isosorbide mononitrate 60 mg daily.  This will also help her blood pressure. Essential hypertension: As mentioned above. Mixed dyslipidemia: Diet was discussed.  These issues are managed by primary care physician. Syncope: Monitoring results are yet to be back.  She mentions to me that she had a syncopal episode while she was on the monitor and I am awaiting those results. Patient will be seen in follow-up appointment in 8 weeks or earlier if the patient has any concerns    Medication Adjustments/Labs and Tests Ordered: Current medicines are reviewed at length with the patient today.  Concerns regarding medicines are outlined above.  No orders of the defined types were placed in this encounter.  No orders of the defined types were placed in this encounter.    No chief complaint on file.    History of Present Illness:    Margaret Lowe is a 64 y.o. female.  Patient was evaluated for chest discomfort-like symptoms and syncope.  Patient has undergone monitoring and waiting the results.  Her stress test was unremarkable.  There was a mild area of possible equivocal area.  At best  this could represent mild ischemia.  Patient essentially leads a very sedentary lifestyle and is completely supplemental oxygen dependent.  At the time of my evaluation, the patient is alert awake oriented and in no distress. Past Medical History:  Diagnosis Date  . Allergic rhinitis   . Chronic rhinitis   . Crohn disease ([place])   . Dyspnea   . Esophageal reflux   . Extrinsic asthma   . Fatigue   . Fibromyalgia   . H/O total hysterectomy   . Hypothyroidism   . Major depression   . Neuropathy   . Sleep apnea with hypersomnolence   . Stroke ([place])   . Vitamin D deficiency     Past Surgical History:  Procedure Laterality Date  . CHOLECYSTECTOMY    . RIGHT HEART CATHETERIZATION N/A 10/06/2012   Procedure: RIGHT HEART CATH;  Surgeon: Jolaine Artist, MD;  Location: [facility] CATH LAB;  Service: Cardiovascular;  Laterality: N/A;    Current Medications: Current Meds  Medication Sig  . acetaminophen (TYLENOL) 500 MG tablet Take 500 mg by mouth every 6 (six) hours as needed for mild pain.  Marland Kitchen albuterol (PROVENTIL HFA) 108 (90 BASE) MCG/ACT inhaler Inhale 2 puffs into the lungs every 6 (six) hours as needed. For shortness of breath/wheezing  . albuterol (PROVENTIL) (2.5 MG/3ML) 0.083% nebulizer solution Take 2.5 mg by nebulization every 6 (six) hours as needed for wheezing or shortness of breath.  . ALPRAZolam (XANAX) 1 MG tablet Take  1 mg by mouth 2 (two) times daily.   Marland Kitchen apixaban (ELIQUIS) 5 MG TABS tablet Take 5 mg by mouth 2 (two) times daily.  . baclofen (LIORESAL) 10 MG tablet Take 5 mg by mouth 2 (two) times daily.  . baclofen (LIORESAL) 20 MG tablet Take 10 mg by mouth 2 (two) times daily.   Marland Kitchen buPROPion (WELLBUTRIN XL) 300 MG 24 hr tablet Take 300 mg by mouth daily.  . Cyanocobalamin (VITAMIN B-12 IJ) Inject 1 mL as directed every 30 (thirty) days.  Marland Kitchen docusate sodium (COLACE) 100 MG capsule Take 200 mg by mouth daily.  Marland Kitchen esomeprazole (NEXIUM) 40 MG capsule Take 40 mg by mouth 2 (two)  times daily.  . fenofibrate (TRICOR) 145 MG tablet Take 145 mg by mouth at bedtime.  . Flaxseed, Linseed, (FLAX SEED OIL PO) Take 1 tablet by mouth daily.  . fluPHENAZine (PROLIXIN) 1 MG tablet Take 1 mg by mouth daily as needed.  . Fluticasone-Salmeterol (ADVAIR) 250-50 MCG/DOSE AEPB Inhale into the lungs.  . folic acid (FOLVITE) 1 MG tablet Take 2-3 mg by mouth See admin instructions. Take 3 tablets at breakfast then take 2 tablets at lunch  . furosemide (LASIX) 20 MG tablet Take 20 mg by mouth every Monday, Wednesday, and Friday.   . insulin aspart (NOVOLOG) 100 UNIT/ML injection Inject 5 Units into the skin 4 (four) times daily -  with meals and at bedtime.  . insulin glargine (LANTUS) 100 UNIT/ML injection Inject into the skin.  Marland Kitchen ipratropium-albuterol (DUONEB) 0.5-2.5 (3) MG/3ML SOLN Take 3 mLs by nebulization 5 (five) times daily as needed. For shortness of breath/wheezing  . levothyroxine (SYNTHROID, LEVOTHROID) 125 MCG tablet Take 125 mcg by mouth daily before breakfast.  . linaclotide (LINZESS) 145 MCG CAPS capsule Take 145 mcg by mouth at bedtime.  . mometasone (NASONEX) 50 MCG/ACT nasal spray Place 1 spray into the nose daily.   . montelukast (SINGULAIR) 10 MG tablet Take 10 mg by mouth at bedtime.  Marland Kitchen morphine (MS CONTIN) 30 MG 12 hr tablet Take 30 mg by mouth every 12 (twelve) hours.  Marland Kitchen nystatin (NYSTATIN) powder Apply topically 3 (three) times daily as needed (for yeast).  . Olopatadine HCl 0.2 % SOLN Apply 1 drop to eye daily.  . ondansetron (ZOFRAN-ODT) 8 MG disintegrating tablet Take 8 mg by mouth every 8 (eight) hours as needed. For nausea/vomiting  . Oxycodone HCl 20 MG TABS Take 20 mg by mouth 3 (three) times daily.  . potassium chloride SA (KLOR-CON) 20 MEQ tablet Take by mouth.  . pramipexole (MIRAPEX) 0.5 MG tablet Take 0.5 mg by mouth at bedtime.  . promethazine (PHENERGAN) 25 MG tablet Take 25 mg by mouth every 6 (six) hours as needed for nausea or vomiting.  .  rosuvastatin (CRESTOR) 10 MG tablet Take 10 mg by mouth daily.  . sertraline (ZOLOFT) 100 MG tablet Take 100 mg by mouth at bedtime.  Marland Kitchen SILENOR 6 MG TABS Take 6 mg by mouth daily.  Marland Kitchen tiotropium (SPIRIVA) 18 MCG inhalation capsule Place 18 mcg into inhaler and inhale daily.  Marland Kitchen topiramate (TOPAMAX) 100 MG tablet Take 100 mg by mouth at bedtime.  . traZODone (DESYREL) 100 MG tablet Take 200 mg by mouth at bedtime.  . TRESIBA FLEXTOUCH 200 UNIT/ML SOPN Inject 25 Units into the skin 2 (two) times daily.  . Vitamin D, Ergocalciferol, (DRISDOL) 50000 units CAPS capsule Take 50,000 Units by mouth every 7 (seven) days.  . vitamin E 400 UNIT capsule  Take 400 Units by mouth daily.     Allergies:   Ciprofloxacin, Pregabalin, Aspirin, Cephalexin, Latex, and Macrodantin [nitrofurantoin macrocrystal]   Social History   Socioeconomic History  . Marital status: Legally Separated    Spouse name: Not on file  . Number of children: Not on file  . Years of education: Not on file  . Highest education level: Not on file  Occupational History  . Not on file  Tobacco Use  . Smoking status: Former Smoker    Quit date: 10/21/1995    Years since quitting: 24.0  . Smokeless tobacco: Never Used  Substance and Sexual Activity  . Alcohol use: Not Currently  . Drug use: No  . Sexual activity: Not on file  Other Topics Concern  . Not on file  Social History Narrative  . Not on file   Social Determinants of Health   Financial Resource Strain:   . Difficulty of Paying Living Expenses: Not on file  Food Insecurity:   . Worried About Programme researcher, broadcasting/film/video in the Last Year: Not on file  . Ran Out of Food in the Last Year: Not on file  Transportation Needs:   . Lack of Transportation (Medical): Not on file  . Lack of Transportation (Non-Medical): Not on file  Physical Activity:   . Days of Exercise per Week: Not on file  . Minutes of Exercise per Session: Not on file  Stress:   . Feeling of Stress : Not on  file  Social Connections:   . Frequency of Communication with Friends and Family: Not on file  . Frequency of Social Gatherings with Friends and Family: Not on file  . Attends Religious Services: Not on file  . Active Member of Clubs or Organizations: Not on file  . Attends Banker Meetings: Not on file  . Marital Status: Not on file     Family History: The patient's family history includes Dementia in her father; Heart attack in her brother, brother, and mother.  ROS:   Please see the history of present illness.    All other systems reviewed and are negative.  EKGs/Labs/Other Studies Reviewed:    The following studies were reviewed today: I discussed stress test report from [facility].  There is an small area of equivocal findings in the septum at best representing very mild ischemia.   Recent Labs: 09/19/2019: BUN 16; Creatinine, Ser 0.92; Hemoglobin 13.4; Platelets 263; Potassium 4.5; Sodium 138  Recent Lipid Panel No results found for: CHOL, TRIG, HDL, CHOLHDL, VLDL, LDLCALC, LDLDIRECT  Physical Exam:    VS:  BP (!) 146/80   Pulse (!) 105   Ht 5' 5.5" (1.664 m)   Wt 174 lb (78.9 kg)   SpO2 100%   BMI 28.51 kg/m     Wt Readings from Last 3 Encounters:  11/04/19 174 lb (78.9 kg)  10/06/19 165 lb (74.8 kg)  09/19/19 166 lb (75.3 kg)     GEN: Patient is in no acute distress HEENT: Normal NECK: No JVD; No carotid bruits LYMPHATICS: No lymphadenopathy CARDIAC: Hear sounds regular, 2/6 systolic murmur at the apex. RESPIRATORY:  Clear to auscultation without rales, wheezing or rhonchi  ABDOMEN: Soft, non-tender, non-distended MUSCULOSKELETAL:  No edema; No deformity  SKIN: Warm and dry NEUROLOGIC:  Alert and oriented x 3 PSYCHIATRIC:  Normal affect   Signed, Garwin Brothers, MD  11/04/2019 11:42 AM    [facility]

## 2019-11-04 NOTE — Patient Instructions (Signed)
Medication Instructions:  Your physician has recommended you make the following change in your medication:   START: IMDUR(isosorbide) 60 mg Take 1 daily  *If you need a refill on your cardiac medications before your next appointment, please call your pharmacy*  Lab Work: None If you have labs (blood work) drawn today and your tests are completely normal, you will receive your results only by: Marland Kitchen MyChart Message (if you have MyChart) OR . A paper copy in the mail If you have any lab test that is abnormal or we need to change your treatment, we will call you to review the results.  Testing/Procedures: None  Follow-Up: At [facility], you and your health needs are our priority.  As part of our continuing mission to provide you with exceptional heart care, we have created designated Provider Care Teams.  These Care Teams include your primary Cardiologist (physician) and Advanced Practice Providers (APPs -  Physician Assistants and Nurse Practitioners) who all work together to provide you with the care you need, when you need it.  Your next appointment:   2 month(s)  The format for your next appointment:   In Person  Provider:   Jyl Heinz, MD  Other Instructions Isosorbide Mononitrate extended-release tablets What is this medicine? ISOSORBIDE MONONITRATE (eye soe SOR bide mon oh NYE trate) is a vasodilator. It relaxes blood vessels, increasing the blood and oxygen supply to your heart. This medicine is used to prevent chest pain caused by angina. It will not help to stop an episode of chest pain. This medicine may be used for other purposes; ask your health care provider or pharmacist if you have questions. COMMON BRAND NAME(S): Imdur, Isotrate ER What should I tell my health care provider before I take this medicine? They need to know if you have any of these conditions:  previous heart attack or heart failure  an unusual or allergic reaction to isosorbide mononitrate,  nitrates, other medicines, foods, dyes, or preservatives  pregnant or trying to get pregnant  breast-feeding How should I use this medicine? Take this medicine by mouth with a glass of water. Follow the directions on the prescription label. Do not crush or chew. Take your medicine at regular intervals. Do not take your medicine more often than directed. Do not stop taking this medicine except on the advice of your doctor or health care professional. Talk to your pediatrician regarding the use of this medicine in children. Special care may be needed. Overdosage: If you think you have taken too much of this medicine contact a poison control center or emergency room at once. NOTE: This medicine is only for you. Do not share this medicine with others. What if I miss a dose? If you miss a dose, take it as soon as you can. If it is almost time for your next dose, take only that dose. Do not take double or extra doses. What may interact with this medicine? Do not take this medicine with any of the following medications:  medicines used to treat erectile dysfunction (ED) like avanafil, sildenafil, tadalafil, and vardenafil  riociguat This medicine may also interact with the following medications:  medicines for high blood pressure  other medicines for angina or heart failure This list may not describe all possible interactions. Give your health care provider a list of all the medicines, herbs, non-prescription drugs, or dietary supplements you use. Also tell them if you smoke, drink alcohol, or use illegal drugs. Some items may interact with your medicine.  What should I watch for while using this medicine? Check your heart rate and blood pressure regularly while you are taking this medicine. Ask your doctor or health care professional what your heart rate and blood pressure should be and when you should contact him or her. Tell your doctor or health care professional if you feel your medicine is no  longer working. You may get dizzy. Do not drive, use machinery, or do anything that needs mental alertness until you know how this medicine affects you. To reduce the risk of dizzy or fainting spells, do not sit or stand up quickly, especially if you are an older patient. Alcohol can make you more dizzy, and increase flushing and rapid heartbeats. Avoid alcoholic drinks. Do not treat yourself for coughs, colds, or pain while you are taking this medicine without asking your doctor or health care professional for advice. Some ingredients may increase your blood pressure. What side effects may I notice from receiving this medicine? Side effects that you should report to your doctor or health care professional as soon as possible:  bluish discoloration of lips, fingernails, or palms of hands  irregular heartbeat, palpitations  low blood pressure  nausea, vomiting  persistent headache  unusually weak or tired Side effects that usually do not require medical attention (report to your doctor or health care professional if they continue or are bothersome):  flushing of the face or neck  rash This list may not describe all possible side effects. Call your doctor for medical advice about side effects. You may report side effects to FDA at 1-800-FDA-1088. Where should I keep my medicine? Keep out of the reach of children. Store between 15 and 30 degrees C (59 and 86 degrees F). Keep container tightly closed. Throw away any unused medicine after the expiration date. NOTE: This sheet is a summary. It may not cover all possible information. If you have questions about this medicine, talk to your doctor, pharmacist, or health care provider.  2020 Elsevier/Gold Standard (2013-08-05 14:48:19)

## 2019-11-16 LAB — HM DIABETES EYE EXAM

## 2019-11-18 ENCOUNTER — Telehealth: Payer: Self-pay

## 2019-11-18 NOTE — Telephone Encounter (Signed)
-----   Message from Garwin Brothers, MD sent at 11/15/2019  4:27 PM EST ----- The results of the study is unremarkable. Please inform patient. I will discuss in detail at next appointment. Cc  primary care/referring physician Garwin Brothers, MD 11/15/2019 4:26 PM

## 2019-11-18 NOTE — Telephone Encounter (Signed)
Left message to call office for results, copy sent to [facility]

## 2019-11-18 NOTE — Telephone Encounter (Signed)
Results relayed, patient states she is still having headaches and nausea. She does not have a BP cuff at home to evaluate her vitals. RN instructed patient to go to Urgent care due to time of day and her symptoms.

## 2019-11-18 NOTE — Telephone Encounter (Signed)
-----   Message from Rajan R Revankar, MD sent at 11/15/2019  4:27 PM EST ----- The results of the study is unremarkable. Please inform patient. I will discuss in detail at next appointment. Cc  primary care/referring physician Rajan R Revankar, MD 11/15/2019 4:26 PM  

## 2019-12-02 ENCOUNTER — Other Ambulatory Visit: Payer: Medicare Other

## 2020-01-09 ENCOUNTER — Ambulatory Visit (INDEPENDENT_AMBULATORY_CARE_PROVIDER_SITE_OTHER): Payer: Medicare Other | Admitting: Cardiology

## 2020-01-09 ENCOUNTER — Encounter: Payer: Self-pay | Admitting: Cardiology

## 2020-01-09 ENCOUNTER — Other Ambulatory Visit: Payer: Self-pay

## 2020-01-09 VITALS — BP 152/60 | HR 55 | Ht 65.5 in

## 2020-01-09 DIAGNOSIS — I10 Essential (primary) hypertension: Secondary | ICD-10-CM

## 2020-01-09 DIAGNOSIS — E119 Type 2 diabetes mellitus without complications: Secondary | ICD-10-CM | POA: Diagnosis not present

## 2020-01-09 DIAGNOSIS — Z794 Long term (current) use of insulin: Secondary | ICD-10-CM

## 2020-01-09 HISTORY — DX: Essential (primary) hypertension: I10

## 2020-01-09 NOTE — Progress Notes (Signed)
Cardiology Office Note:    Date:  01/09/2020   ID:  Margaret Lowe, DOB 11-08-1955, MRN [id number]  PCP:  [facility]  Cardiologist:  Jenean Lindau, MD   Referring MD: [facility]    ASSESSMENT:    1. Diabetes mellitus type 2, insulin dependent ([place])   2. Essential hypertension    PLAN:    In order of problems listed above:  1. Mildly abnormal stress test: Medical therapy has been pursued.  Secondary prevention stressed.  Importance of compliance with diet and medication stressed and she vocalized understanding. 2. Essential hypertension: Blood pressure is stable.  She has pain which contributes to sporadic elevation in blood pressures.  She keeps a good track of them at home. 3. Diabetes mellitus: Diet was emphasized.  Blood work followed by primary care physician and she mentions to me that she will have blood work end of the week. 4. Patient will be seen in follow-up appointment in 6 months or earlier if the patient has any concerns    Medication Adjustments/Labs and Tests Ordered: Current medicines are reviewed at length with the patient today.  Concerns regarding medicines are outlined above.  No orders of the defined types were placed in this encounter.  No orders of the defined types were placed in this encounter.    Chief Complaint  Patient presents with  . Follow-up    2 Months     History of Present Illness:    Margaret Lowe is a 64 y.o. female.  Patient was evaluated by me with a stress test.  There was equivocal suggestion of mild ischemia.  Patient subsequently was initiated on isosorbide and she is doing fine.  No chest pain orthopnea or PND.  Her blood pressure is mildly elevated today.  She tells me that she has pain syndrome and therefore when she has pain her blood pressure is up otherwise her blood pressure is fine.  At the time of my evaluation, the patient is alert awake oriented and in no distress.  Past Medical History:    Diagnosis Date  . Acquired hypothyroidism 04/10/2016  . Acute cystitis without hematuria 05/09/2016  . Allergic rhinitis   . Carpal tunnel syndrome 08/25/2013  . Chest tightness 10/06/2019  . Chronic rhinitis   . COPD (chronic obstructive pulmonary disease) ([place]) 10/06/2019  . Crohn disease ([place])   . Diabetes mellitus type 2, insulin dependent ([place]) 05/09/2016  . Disequilibrium 05/09/2016  . Dyspnea   . Esophageal reflux   . Extrinsic asthma   . Fatigue   . Fibromyalgia   . Generalized anxiety disorder 05/09/2016  . H/O total hysterectomy   . Hammer toes, bilateral 04/24/2016  . History of syncope 05/09/2016   Overview:  Having work-up by Neurology outpatient. Formatting of this note might be different from the original. Overview:  Having work-up by Neurology outpatient.  . Hypothyroidism   . Idiopathic progressive polyneuropathy 08/25/2013  . Irritable bowel syndrome with constipation 05/09/2016  . Major depression   . Neuropathy   . Overweight (BMI 25.0-29.9) 04/10/2016  . Pain of left great toe 04/24/2016  . Physical debility 05/09/2016  . Polypharmacy 05/09/2016  . Sleep apnea with hypersomnolence   . Spell of altered consciousness 03/26/2016  . Stroke ([place])   . Supplemental oxygen dependent 10/06/2019  . Syncope and collapse 10/06/2019  . Vitamin D deficiency   . Wheelchair bound 05/09/2016    Past Surgical History:  Procedure Laterality Date  . CHOLECYSTECTOMY    .  RIGHT HEART CATHETERIZATION N/A 10/06/2012   Procedure: RIGHT HEART CATH;  Surgeon: Dolores Patty, MD;  Location: [facility] CATH LAB;  Service: Cardiovascular;  Laterality: N/A;    Current Medications: Current Meds  Medication Sig  . albuterol (PROVENTIL HFA) 108 (90 BASE) MCG/ACT inhaler Inhale 2 puffs into the lungs every 6 (six) hours as needed. For shortness of breath/wheezing  . albuterol (PROVENTIL) (2.5 MG/3ML) 0.083% nebulizer solution Take 2.5 mg by nebulization every 6 (six) hours as needed for wheezing or  shortness of breath.  . ALPRAZolam (XANAX) 1 MG tablet Take 1 mg by mouth 2 (two) times daily.   Marland Kitchen apixaban (ELIQUIS) 5 MG TABS tablet Take 5 mg by mouth 2 (two) times daily.  . baclofen (LIORESAL) 10 MG tablet Take 5 mg by mouth 2 (two) times daily.  . baclofen (LIORESAL) 20 MG tablet Take 10 mg by mouth 2 (two) times daily.   Marland Kitchen buPROPion (WELLBUTRIN XL) 300 MG 24 hr tablet Take 300 mg by mouth daily.  . Cyanocobalamin (VITAMIN B-12 IJ) Inject 1 mL as directed every 30 (thirty) days.  Marland Kitchen docusate sodium (COLACE) 100 MG capsule Take 200 mg by mouth daily.  Marland Kitchen esomeprazole (NEXIUM) 40 MG capsule Take 40 mg by mouth 2 (two) times daily.  . fenofibrate (TRICOR) 145 MG tablet Take 145 mg by mouth at bedtime.  . Flaxseed, Linseed, (FLAX SEED OIL PO) Take 1 tablet by mouth daily.  . fluPHENAZine (PROLIXIN) 1 MG tablet Take 1 mg by mouth daily as needed.  . Fluticasone-Salmeterol (ADVAIR) 250-50 MCG/DOSE AEPB Inhale into the lungs.  . folic acid (FOLVITE) 1 MG tablet Take 2-3 mg by mouth See admin instructions. Take 3 tablets at breakfast then take 2 tablets at lunch  . furosemide (LASIX) 20 MG tablet Take 20 mg by mouth every Monday, Wednesday, and Friday.   . insulin aspart (NOVOLOG) 100 UNIT/ML injection Inject 5 Units into the skin 4 (four) times daily -  with meals and at bedtime.  . insulin glargine (LANTUS) 100 UNIT/ML injection Inject into the skin.  Marland Kitchen ipratropium-albuterol (DUONEB) 0.5-2.5 (3) MG/3ML SOLN Take 3 mLs by nebulization 5 (five) times daily as needed. For shortness of breath/wheezing  . isosorbide mononitrate (IMDUR) 60 MG 24 hr tablet Take 1 tablet (60 mg total) by mouth daily.  Marland Kitchen levothyroxine (SYNTHROID, LEVOTHROID) 125 MCG tablet Take 125 mcg by mouth daily before breakfast.  . linaclotide (LINZESS) 145 MCG CAPS capsule Take 145 mcg by mouth at bedtime.  Marland Kitchen LORazepam (ATIVAN) 0.5 MG tablet Take 0.5 mg by mouth 2 (two) times daily as needed.  . mometasone (NASONEX) 50 MCG/ACT  nasal spray Place 1 spray into the nose daily.   . montelukast (SINGULAIR) 10 MG tablet Take 10 mg by mouth at bedtime.  Marland Kitchen nystatin (NYSTATIN) powder Apply topically 3 (three) times daily as needed (for yeast).  . Olopatadine HCl 0.2 % SOLN Apply 1 drop to eye daily.  . ondansetron (ZOFRAN-ODT) 8 MG disintegrating tablet Take 8 mg by mouth every 8 (eight) hours as needed. For nausea/vomiting  . potassium chloride SA (KLOR-CON) 20 MEQ tablet Take by mouth.  . pramipexole (MIRAPEX) 0.5 MG tablet Take 0.5 mg by mouth at bedtime.  . promethazine (PHENERGAN) 25 MG tablet Take 25 mg by mouth every 6 (six) hours as needed for nausea or vomiting.  . rosuvastatin (CRESTOR) 10 MG tablet Take 10 mg by mouth daily.  . sertraline (ZOLOFT) 100 MG tablet Take 100 mg by mouth at  bedtime.  Marland Kitchen SILENOR 6 MG TABS Take 6 mg by mouth daily.  Marland Kitchen tiotropium (SPIRIVA) 18 MCG inhalation capsule Place 18 mcg into inhaler and inhale daily.  . traZODone (DESYREL) 100 MG tablet Take 200 mg by mouth at bedtime.  . Vitamin D, Ergocalciferol, (DRISDOL) 50000 units CAPS capsule Take 50,000 Units by mouth every 7 (seven) days.  . vitamin E 400 UNIT capsule Take 400 Units by mouth daily.     Allergies:   Ciprofloxacin, Pregabalin, Aspirin, Cephalexin, Latex, and Macrodantin [nitrofurantoin macrocrystal]   Social History   Socioeconomic History  . Marital status: Widowed    Spouse name: Not on file  . Number of children: Not on file  . Years of education: Not on file  . Highest education level: Not on file  Occupational History  . Not on file  Tobacco Use  . Smoking status: Former Smoker    Quit date: 10/21/1995    Years since quitting: 24.2  . Smokeless tobacco: Never Used  Substance and Sexual Activity  . Alcohol use: Not Currently  . Drug use: No  . Sexual activity: Not on file  Other Topics Concern  . Not on file  Social History Narrative  . Not on file   Social Determinants of Health   Financial Resource  Strain:   . Difficulty of Paying Living Expenses:   Food Insecurity:   . Worried About Programme researcher, broadcasting/film/video in the Last Year:   . Barista in the Last Year:   Transportation Needs:   . Freight forwarder (Medical):   Marland Kitchen Lack of Transportation (Non-Medical):   Physical Activity:   . Days of Exercise per Week:   . Minutes of Exercise per Session:   Stress:   . Feeling of Stress :   Social Connections:   . Frequency of Communication with Friends and Family:   . Frequency of Social Gatherings with Friends and Family:   . Attends Religious Services:   . Active Member of Clubs or Organizations:   . Attends Banker Meetings:   Marland Kitchen Marital Status:      Family History: The patient's family history includes Dementia in her father; Heart attack in her brother, brother, and mother.  ROS:   Please see the history of present illness.    All other systems reviewed and are negative.  EKGs/Labs/Other Studies Reviewed:    The following studies were reviewed today: I discussed results of the stress test again with her at length.   Recent Labs: 09/19/2019: BUN 16; Creatinine, Ser 0.92; Hemoglobin 13.4; Platelets 263; Potassium 4.5; Sodium 138  Recent Lipid Panel No results found for: CHOL, TRIG, HDL, CHOLHDL, VLDL, LDLCALC, LDLDIRECT  Physical Exam:    VS:  BP (!) 152/60   Pulse (!) 55   Ht 5' 5.5" (1.664 m)   SpO2 92%   BMI 28.51 kg/m     Wt Readings from Last 3 Encounters:  11/04/19 174 lb (78.9 kg)  10/06/19 165 lb (74.8 kg)  09/19/19 166 lb (75.3 kg)     GEN: Patient is in no acute distress HEENT: Normal NECK: No JVD; No carotid bruits LYMPHATICS: No lymphadenopathy CARDIAC: Hear sounds regular, 2/6 systolic murmur at the apex. RESPIRATORY:  Clear to auscultation without rales, wheezing or rhonchi  ABDOMEN: Soft, non-tender, non-distended MUSCULOSKELETAL:  No edema; No deformity  SKIN: Warm and dry NEUROLOGIC:  Alert and oriented x 3 PSYCHIATRIC:   Normal affect   Signed, Aundra Dubin  Elnora Quizon, MD  01/09/2020 11:38 AM    [facility]

## 2020-01-09 NOTE — Patient Instructions (Signed)
Medication Instructions:  No medication changes *If you need a refill on your cardiac medications before your next appointment, please call your pharmacy*   Lab Work: None ordered If you have labs (blood work) drawn today and your tests are completely normal, you will receive your results only by: . MyChart Message (if you have MyChart) OR . A paper copy in the mail If you have any lab test that is abnormal or we need to change your treatment, we will call you to review the results.   Testing/Procedures: None ordered   Follow-Up: At [facility], you and your health needs are our priority.  As part of our continuing mission to provide you with exceptional heart care, we have created designated Provider Care Teams.  These Care Teams include your primary Cardiologist (physician) and Advanced Practice Providers (APPs -  Physician Assistants and Nurse Practitioners) who all work together to provide you with the care you need, when you need it.  We recommend signing up for the patient portal called "MyChart".  Sign up information is provided on this After Visit Summary.  MyChart is used to connect with patients for Virtual Visits (Telemedicine).  Patients are able to view lab/test results, encounter notes, upcoming appointments, etc.  Non-urgent messages can be sent to your provider as well.   To learn more about what you can do with MyChart, go to [email or web address].    Your next appointment:   6 month(s)  The format for your next appointment:   In Person  Provider:   Rajan Revankar, MD   Other Instructions Have a great day!!  

## 2020-01-10 ENCOUNTER — Ambulatory Visit: Payer: Medicare Other | Admitting: Cardiology

## 2020-01-13 ENCOUNTER — Other Ambulatory Visit: Payer: Self-pay

## 2020-01-13 ENCOUNTER — Ambulatory Visit (INDEPENDENT_AMBULATORY_CARE_PROVIDER_SITE_OTHER): Payer: Medicare Other

## 2020-01-13 DIAGNOSIS — R079 Chest pain, unspecified: Secondary | ICD-10-CM

## 2020-01-13 DIAGNOSIS — R0789 Other chest pain: Secondary | ICD-10-CM | POA: Diagnosis not present

## 2020-01-13 DIAGNOSIS — R55 Syncope and collapse: Secondary | ICD-10-CM | POA: Diagnosis not present

## 2020-01-13 DIAGNOSIS — J431 Panlobular emphysema: Secondary | ICD-10-CM

## 2020-01-13 DIAGNOSIS — Z9981 Dependence on supplemental oxygen: Secondary | ICD-10-CM | POA: Diagnosis not present

## 2020-01-13 NOTE — Progress Notes (Signed)
Complete echocardiogram has been performed.  Jimmy Jodeci Roarty RDCS, RVT 

## 2020-01-15 ENCOUNTER — Encounter (INDEPENDENT_AMBULATORY_CARE_PROVIDER_SITE_OTHER): Payer: Self-pay

## 2020-01-23 ENCOUNTER — Telehealth: Payer: Self-pay

## 2020-01-23 NOTE — Telephone Encounter (Signed)
Called and spoke with the patient regarding results and explained that they were sent to another pt by accident. Pt verbalized acknowledgment and had no questions.

## 2020-03-20 ENCOUNTER — Other Ambulatory Visit: Payer: Self-pay

## 2020-03-20 ENCOUNTER — Ambulatory Visit (INDEPENDENT_AMBULATORY_CARE_PROVIDER_SITE_OTHER): Payer: Medicare Other | Admitting: Cardiology

## 2020-03-20 ENCOUNTER — Encounter: Payer: Self-pay | Admitting: Cardiology

## 2020-03-20 VITALS — BP 132/58 | HR 95 | Ht 65.5 in | Wt 190.0 lb

## 2020-03-20 DIAGNOSIS — I10 Essential (primary) hypertension: Secondary | ICD-10-CM

## 2020-03-20 NOTE — Progress Notes (Signed)
Cardiology Office Note:    Date:  03/20/2020   ID:  Margaret Lowe, DOB 07-13-1956, MRN [id number]  PCP:  [facility]  Cardiologist:  Garwin Brothers, MD   Referring MD: [facility]    ASSESSMENT:    No diagnosis found. PLAN:    In order of problems listed above:  1. Primary prevention stressed with the patient.  Importance of compliance with diet medication stressed he vocalized understanding. 2. I discussed with her the findings of stress testing and echocardiography.  She vocalized understanding.  Her monitoring was fine.  She is happy to know these results. 3. Diet was discussed and weight reduction was stressed.  She uses oxygen on a regular basis. 4. Patient will be seen in follow-up appointment in 6 months or earlier if the patient has any concerns    Medication Adjustments/Labs and Tests Ordered: Current medicines are reviewed at length with the patient today.  Concerns regarding medicines are outlined above.  No orders of the defined types were placed in this encounter.  No orders of the defined types were placed in this encounter.    No chief complaint on file.    History of Present Illness:    Margaret Lowe is a 64 y.o. female.  Patient has past medical history of syncope.  She has history of diabetes mellitus.  Her stress test and monitoring was unremarkable.  She is brought in here on a wheelchair and is on oxygen.  At the time of my evaluation, the patient is alert awake oriented and in no distress.  Past Medical History:  Diagnosis Date  . Acquired hypothyroidism 04/10/2016  . Acute cystitis without hematuria 05/09/2016  . Allergic rhinitis   . Carpal tunnel syndrome 08/25/2013  . Chest tightness 10/06/2019  . Chronic rhinitis   . COPD (chronic obstructive pulmonary disease) (HCC) 10/06/2019  . Crohn disease (HCC)   . Diabetes mellitus type 2, insulin dependent (HCC) 05/09/2016  . Disequilibrium 05/09/2016  . Dyspnea   . Esophageal  reflux   . Extrinsic asthma   . Fatigue   . Fibromyalgia   . Generalized anxiety disorder 05/09/2016  . H/O total hysterectomy   . Hammer toes, bilateral 04/24/2016  . History of syncope 05/09/2016   Overview:  Having work-up by Neurology outpatient. Formatting of this note might be different from the original. Overview:  Having work-up by Neurology outpatient.  . Hypothyroidism   . Idiopathic progressive polyneuropathy 08/25/2013  . Irritable bowel syndrome with constipation 05/09/2016  . Major depression   . Neuropathy   . Overweight (BMI 25.0-29.9) 04/10/2016  . Pain of left great toe 04/24/2016  . Physical debility 05/09/2016  . Polypharmacy 05/09/2016  . Sleep apnea with hypersomnolence   . Spell of altered consciousness 03/26/2016  . Stroke (HCC)   . Supplemental oxygen dependent 10/06/2019  . Syncope and collapse 10/06/2019  . Vitamin D deficiency   . Wheelchair bound 05/09/2016    Past Surgical History:  Procedure Laterality Date  . CHOLECYSTECTOMY    . RIGHT HEART CATHETERIZATION N/A 10/06/2012   Procedure: RIGHT HEART CATH;  Surgeon: Dolores Patty, MD;  Location: [facility] CATH LAB;  Service: Cardiovascular;  Laterality: N/A;    Current Medications: Current Meds  Medication Sig  . albuterol (PROVENTIL HFA) 108 (90 BASE) MCG/ACT inhaler Inhale 2 puffs into the lungs every 6 (six) hours as needed. For shortness of breath/wheezing  . albuterol (PROVENTIL) (2.5 MG/3ML) 0.083% nebulizer solution Take 2.5 mg by  nebulization every 6 (six) hours as needed for wheezing or shortness of breath.  Marland Kitchen apixaban (ELIQUIS) 5 MG TABS tablet Take 5 mg by mouth 2 (two) times daily.  . baclofen (LIORESAL) 10 MG tablet Take 5 mg by mouth 2 (two) times daily.  Marland Kitchen esomeprazole (NEXIUM) 40 MG capsule Take 40 mg by mouth 2 (two) times daily.  . fenofibrate (TRICOR) 145 MG tablet Take 145 mg by mouth at bedtime.  . Flaxseed, Linseed, (FLAX SEED OIL PO) Take 1 tablet by mouth daily.  . fluPHENAZine  (PROLIXIN) 1 MG tablet Take 1 mg by mouth daily as needed.  . Fluticasone-Salmeterol (ADVAIR) 250-50 MCG/DOSE AEPB Inhale into the lungs.  . furosemide (LASIX) 20 MG tablet Take 20 mg by mouth every Monday, Wednesday, and Friday.   . insulin aspart (NOVOLOG) 100 UNIT/ML injection Inject 5 Units into the skin 4 (four) times daily -  with meals and at bedtime.  Marland Kitchen ipratropium-albuterol (DUONEB) 0.5-2.5 (3) MG/3ML SOLN Take 3 mLs by nebulization 5 (five) times daily as needed. For shortness of breath/wheezing  . isosorbide mononitrate (IMDUR) 60 MG 24 hr tablet Take 1 tablet (60 mg total) by mouth daily.  Marland Kitchen levothyroxine (SYNTHROID, LEVOTHROID) 125 MCG tablet Take 125 mcg by mouth daily before breakfast.  . mometasone (NASONEX) 50 MCG/ACT nasal spray Place 1 spray into the nose daily.   . montelukast (SINGULAIR) 10 MG tablet Take 10 mg by mouth at bedtime.  Marland Kitchen nystatin (NYSTATIN) powder Apply topically 3 (three) times daily as needed (for yeast).  . Olopatadine HCl 0.2 % SOLN Apply 1 drop to eye daily.  . potassium chloride SA (KLOR-CON) 20 MEQ tablet Take by mouth.  . pramipexole (MIRAPEX) 0.5 MG tablet Take 0.5 mg by mouth at bedtime.  . promethazine (PHENERGAN) 25 MG tablet Take 25 mg by mouth every 6 (six) hours as needed for nausea or vomiting.  . rosuvastatin (CRESTOR) 10 MG tablet Take 10 mg by mouth daily.  Marland Kitchen SILENOR 6 MG TABS Take 6 mg by mouth daily.  . TRESIBA FLEXTOUCH 200 UNIT/ML SOPN Inject 25 Units into the skin 2 (two) times daily.  . vitamin E 400 UNIT capsule Take 400 Units by mouth daily.     Allergies:   Ciprofloxacin, Pregabalin, Aspirin, Cephalexin, Latex, and Macrodantin [nitrofurantoin macrocrystal]   Social History   Socioeconomic History  . Marital status: Widowed    Spouse name: Not on file  . Number of children: Not on file  . Years of education: Not on file  . Highest education level: Not on file  Occupational History  . Not on file  Tobacco Use  . Smoking  status: Former Smoker    Quit date: 10/21/1995    Years since quitting: 24.4  . Smokeless tobacco: Never Used  Substance and Sexual Activity  . Alcohol use: Not Currently  . Drug use: No  . Sexual activity: Not on file  Other Topics Concern  . Not on file  Social History Narrative  . Not on file   Social Determinants of Health   Financial Resource Strain:   . Difficulty of Paying Living Expenses:   Food Insecurity:   . Worried About Charity fundraiser in the Last Year:   . Arboriculturist in the Last Year:   Transportation Needs:   . Film/video editor (Medical):   Marland Kitchen Lack of Transportation (Non-Medical):   Physical Activity:   . Days of Exercise per Week:   . Minutes of  Exercise per Session:   Stress:   . Feeling of Stress :   Social Connections:   . Frequency of Communication with Friends and Family:   . Frequency of Social Gatherings with Friends and Family:   . Attends Religious Services:   . Active Member of Clubs or Organizations:   . Attends Banker Meetings:   Marland Kitchen Marital Status:      Family History: The patient's family history includes Dementia in her father; Heart attack in her brother, brother, and mother.  ROS:   Please see the history of present illness.    All other systems reviewed and are negative.  EKGs/Labs/Other Studies Reviewed:    The following studies were reviewed today: EVENT MONITOR REPORT:   Patient was monitored from 10/06/2019 to 10/20/2019. Indication:                    Syncope and collapse Ordering physician:  Garwin Brothers, MD  Referring physician:        Garwin Brothers, MD    Baseline rhythm: Sinus  Minimum heart rate: 55 BPM.  Average heart rate: 95 BPM.  Maximal heart rate 145 BPM.  Atrial arrhythmia: None significant  Ventricular arrhythmia: None significant  Conduction abnormality: None significant  Symptoms: None significant   Conclusion:  Monitoring was within normal  limits.  Interpreting  cardiologist: Garwin Brothers, MD  Date: 11/15/2019 4:17 PM    Recent Labs: 09/19/2019: BUN 16; Creatinine, Ser 0.92; Hemoglobin 13.4; Platelets 263; Potassium 4.5; Sodium 138  Recent Lipid Panel No results found for: CHOL, TRIG, HDL, CHOLHDL, VLDL, LDLCALC, LDLDIRECT  Physical Exam:    VS:  BP (!) 132/58   Pulse 95   Ht 5' 5.5" (1.664 m)   Wt 190 lb (86.2 kg)   SpO2 99%   BMI 31.14 kg/m     Wt Readings from Last 3 Encounters:  03/20/20 190 lb (86.2 kg)  11/04/19 174 lb (78.9 kg)  10/06/19 165 lb (74.8 kg)     GEN: Patient is in no acute distress HEENT: Normal NECK: No JVD; No carotid bruits LYMPHATICS: No lymphadenopathy CARDIAC: Hear sounds regular, 2/6 systolic murmur at the apex. RESPIRATORY:  Clear to auscultation without rales, wheezing or rhonchi  ABDOMEN: Soft, non-tender, non-distended MUSCULOSKELETAL:  No edema; No deformity  SKIN: Warm and dry NEUROLOGIC:  Alert and oriented x 3 PSYCHIATRIC:  Normal affect   Signed, Garwin Brothers, MD  03/20/2020 10:02 AM    [facility]

## 2020-03-20 NOTE — Patient Instructions (Addendum)

## 2020-03-26 LAB — HEPATIC FUNCTION PANEL
ALT: 28 IU/L (ref 0–32)
AST: 19 IU/L (ref 0–40)
Albumin: 4.4 g/dL (ref 3.8–4.8)
Alkaline Phosphatase: 94 IU/L (ref 48–121)
Bilirubin Total: <0.2 mg/dL (ref 0.0–1.2)
Bilirubin, Direct: 0.09 mg/dL (ref 0.00–0.40)
Total Protein: 6.7 g/dL (ref 6.0–8.5)

## 2020-03-26 LAB — BASIC METABOLIC PANEL
BUN/Creatinine Ratio: 24 (ref 12–28)
BUN: 27 mg/dL (ref 8–27)
CO2: 27 mmol/L (ref 20–29)
Calcium: 9.9 mg/dL (ref 8.7–10.3)
Chloride: 103 mmol/L (ref 96–106)
Creatinine, Ser: 1.11 mg/dL — ABNORMAL HIGH (ref 0.57–1.00)
GFR calc Af Amer: 61 mL/min/{1.73_m2} (ref >59)
GFR calc non Af Amer: 53 mL/min/{1.73_m2} — ABNORMAL LOW (ref >59)
Glucose: 239 mg/dL — ABNORMAL HIGH (ref 65–99)
Potassium: 4.9 mmol/L (ref 3.5–5.2)
Sodium: 142 mmol/L (ref 134–144)

## 2020-03-26 LAB — LIPID PANEL
Chol/HDL Ratio: 2.2 ratio (ref 0.0–4.4)
Cholesterol, Total: 158 mg/dL (ref 100–199)
HDL: 72 mg/dL (ref >39)
LDL Chol Calc (NIH): 67 mg/dL (ref 0–99)
Triglycerides: 107 mg/dL (ref 0–149)
VLDL Cholesterol Cal: 19 mg/dL (ref 5–40)

## 2020-03-30 ENCOUNTER — Telehealth: Payer: Self-pay | Admitting: Cardiology

## 2020-03-30 NOTE — Telephone Encounter (Signed)
Results reviewed with pt as per Dr. Revankar's note.  Pt verbalized understanding and had no additional questions.   

## 2020-03-30 NOTE — Telephone Encounter (Signed)
New message ° ° °Patient is returning call for lab results. Please call. °

## 2020-04-05 ENCOUNTER — Other Ambulatory Visit: Payer: Self-pay | Admitting: Cardiology

## 2020-04-11 ENCOUNTER — Other Ambulatory Visit: Payer: Self-pay

## 2020-04-11 MED ORDER — ISOSORBIDE MONONITRATE ER 60 MG PO TB24: 60.0000 mg | ORAL_TABLET | Freq: Every day | ORAL | 1 refills | Status: DC

## 2020-04-11 NOTE — Telephone Encounter (Signed)
Rx sent in for Isosorbide 60 mg to [facility] pharmacy

## 2020-04-11 NOTE — Telephone Encounter (Signed)
Rx refill sent to different pharmacy for Isosorbide 60mg .

## 2020-04-11 NOTE — Telephone Encounter (Signed)
*  STAT* If patient is at the pharmacy, call can be transferred to refill team.   1. Which medications need to be refilled? (please list name of each medication and dose if known) isosorbide mononitrate (IMDUR) 60 MG 24 hr tablet  2. Which pharmacy/location (including street and city if local pharmacy) is medication to be sent to? [facility] Mail order   3. Do they need a 30 day or 90 day supply? 90 day

## 2020-06-01 IMAGING — CT CT ANGIO CHEST
2 of 6 series · 19 of 36 positions shown · IV contrast (omnipaque)
Comparison: None.

CLINICAL DATA: Chest pain and shortness of breath for 2 weeks.
Recent syncopal episode. Hypoxia. High pretest probability for
pulmonary embolism.

EXAM:
CT ANGIOGRAPHY CHEST WITH CONTRAST
TECHNIQUE: Multidetector CT imaging of the chest was performed using the
standard protocol during bolus administration of intravenous
contrast. Multiplanar CT image reconstructions and MIPs were
obtained to evaluate the vascular anatomy.
CONTRAST:  75mL OMNIPAQUE IOHEXOL 350 MG/ML SOLN

[Series 7: pe thins · axial · 0.73mm/px · z∈[-29,+213]mm · 18 of 385 slices shown]
[im 20/385  lung]
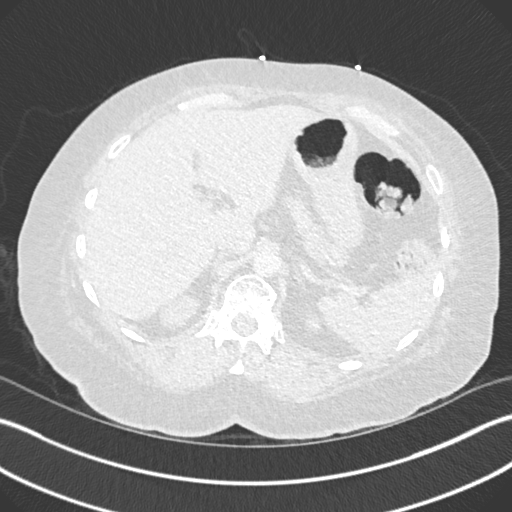
[im 39/385  mediastinal]
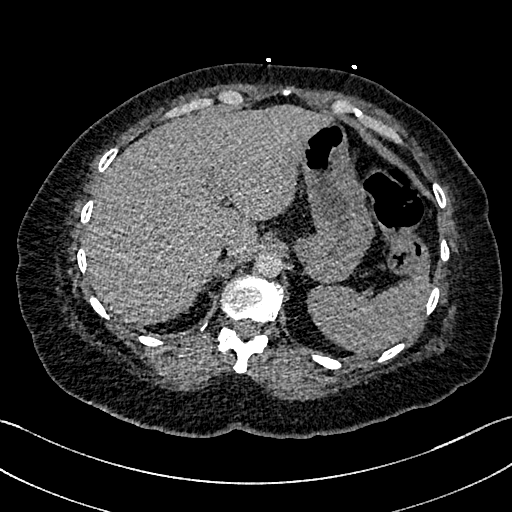
[im 58/385  lung]
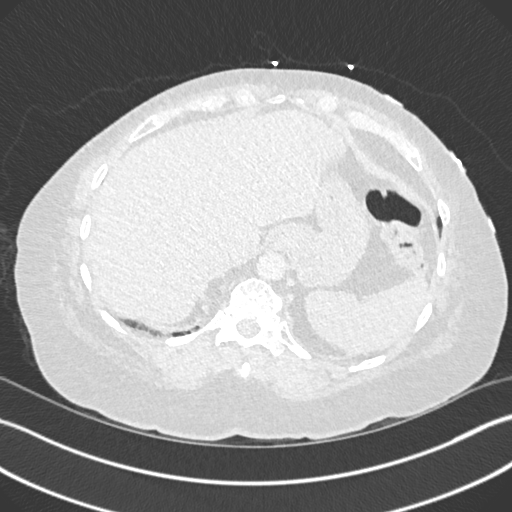
[im 77/385  mediastinal]
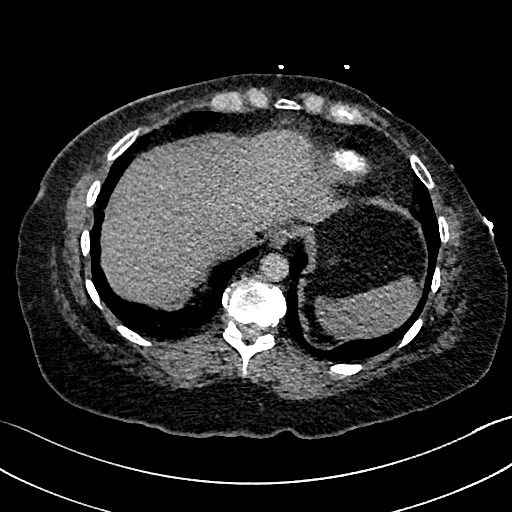
[im 97/385  lung]
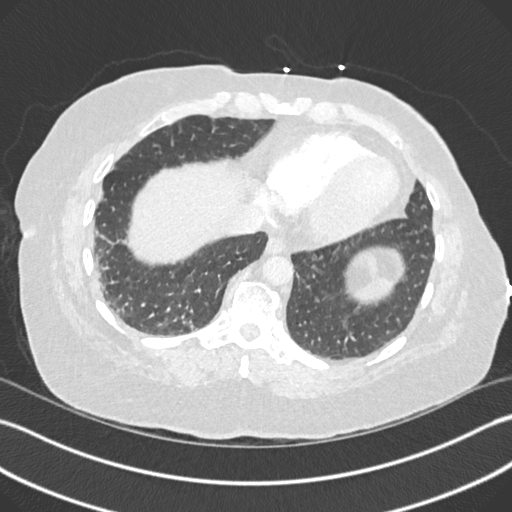
[im 116/385  mediastinal]
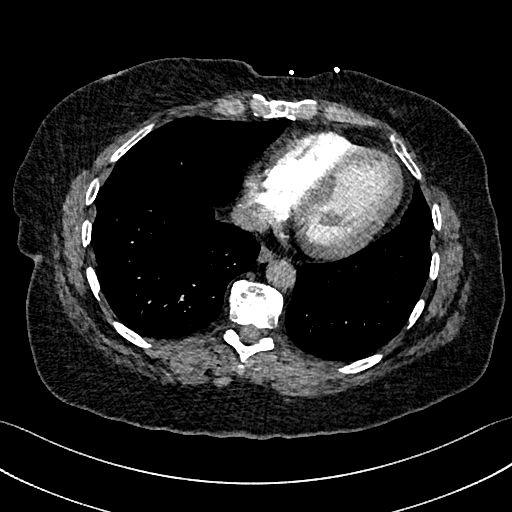
[im 135/385  lung]
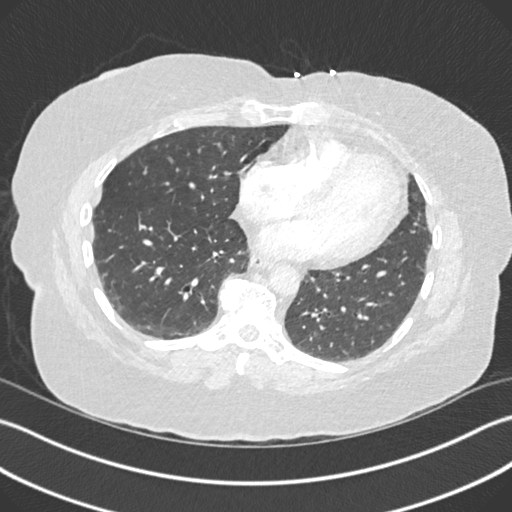
[im 154/385  mediastinal]
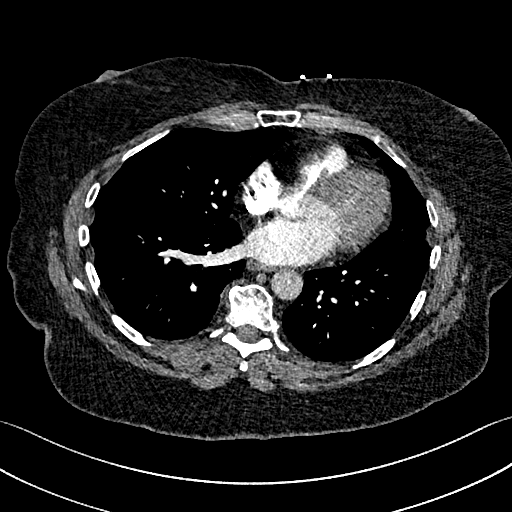
[im 173/385  lung]
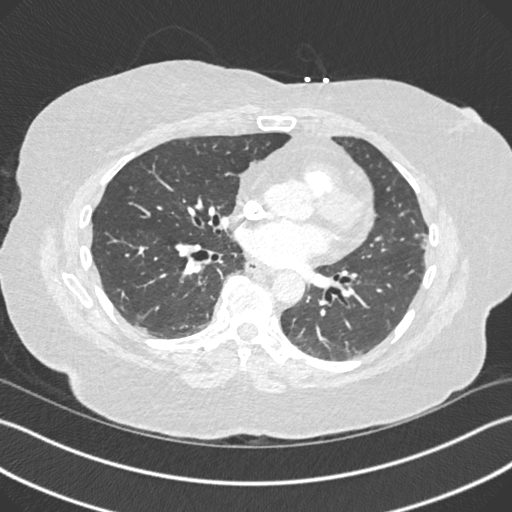
[im 212/385  mediastinal]
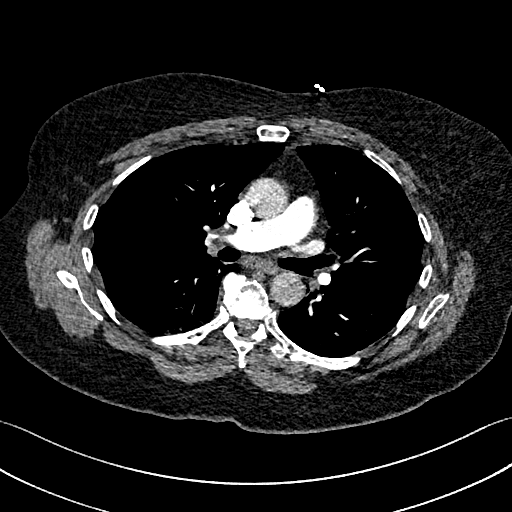
[im 231/385  lung]
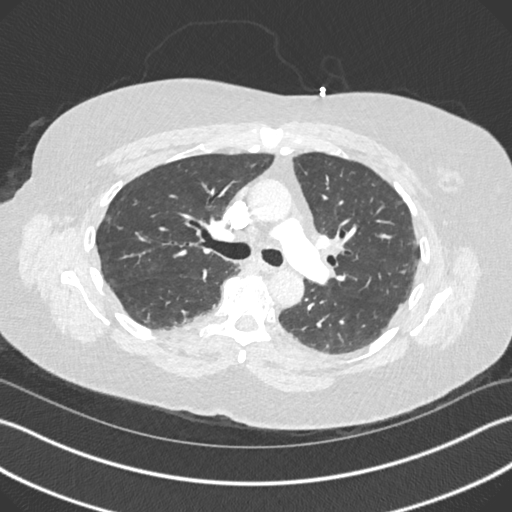
[im 250/385  mediastinal]
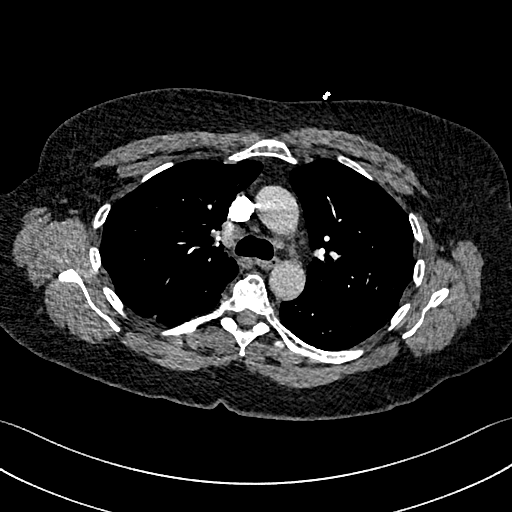
[im 269/385  lung]
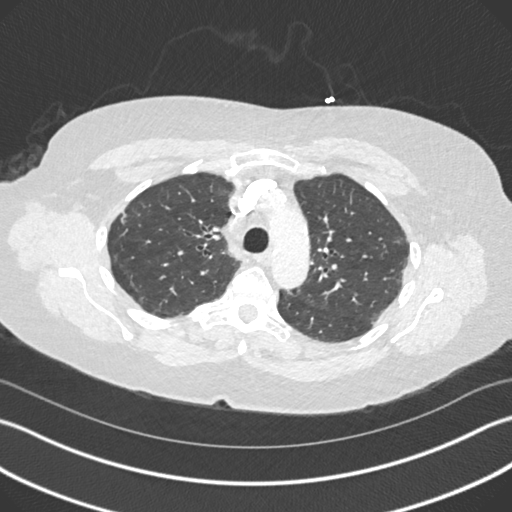
[im 289/385  mediastinal]
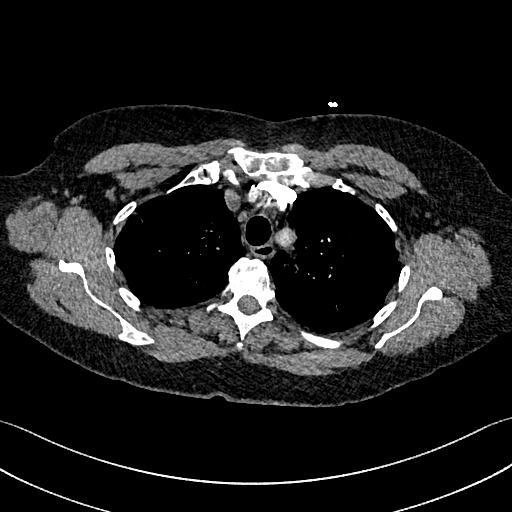
[im 308/385  lung]
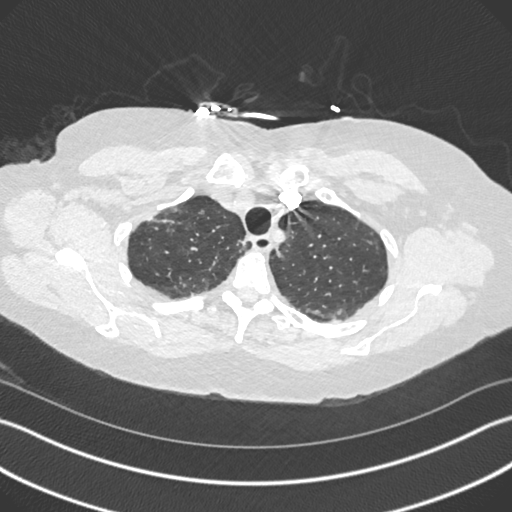
[im 327/385  mediastinal]
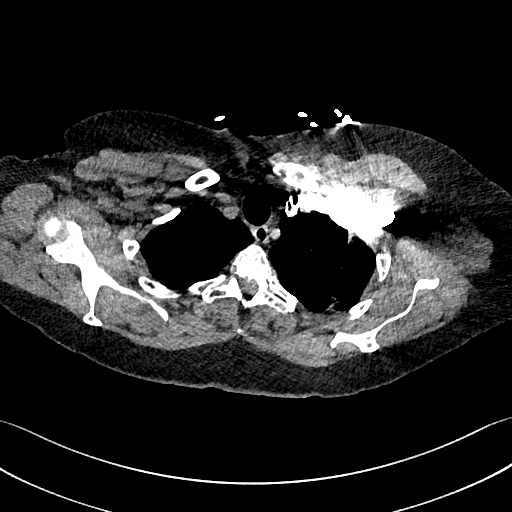
[im 346/385  lung]
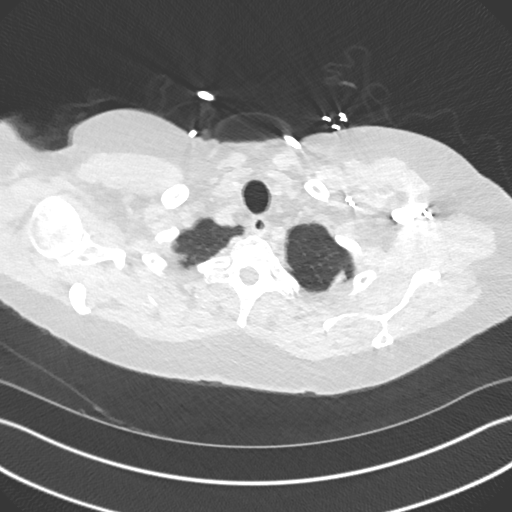
[im 365/385  mediastinal]
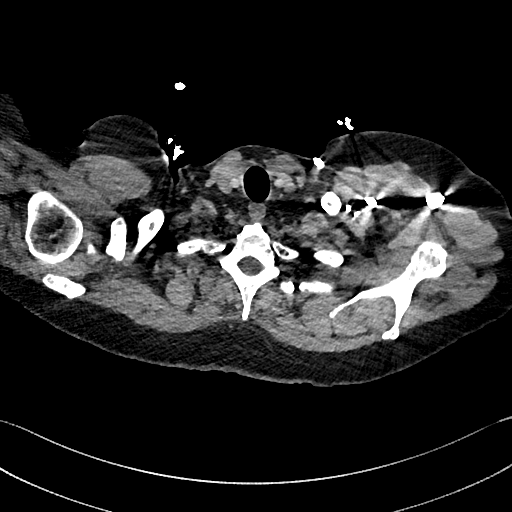

[Series 8: pe 2mm cor · coronal · 0.53mm/px · 1 of 150 slices shown]
[im 75/150  mediastinal]
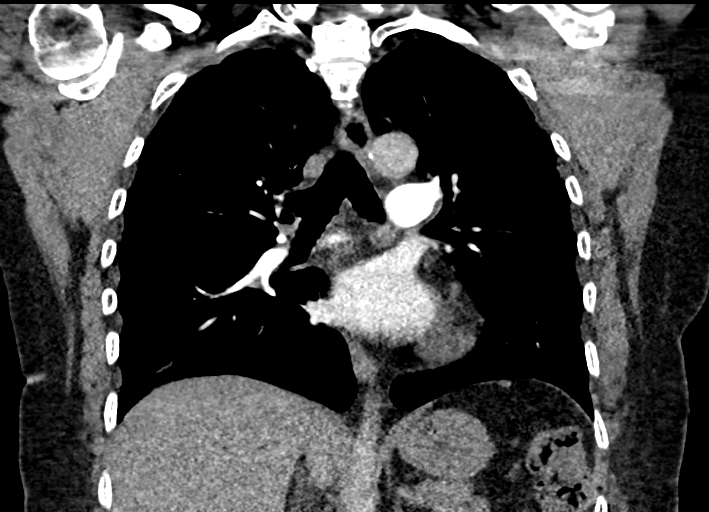

[19 of 36 positions shown; findings below may reference images not displayed]

FINDINGS: Cardiovascular: Satisfactory opacification of pulmonary arteries
noted, and no pulmonary emboli identified. No evidence of thoracic
aortic dissection or aneurysm. Aortic atherosclerosis.

Mediastinum/Nodes: No masses or pathologically enlarged lymph nodes
identified.

Lungs/Pleura: No pulmonary mass, infiltrate, or effusion. Stable
mild bilateral pleural-parenchymal scarring.

Upper abdomen: No acute findings.

Musculoskeletal: No suspicious bone lesions identified.

Review of the MIP images confirms the above findings.
IMPRESSION: No evidence of pulmonary embolism or other active disease.

Aortic Atherosclerosis ([id number]-[id number]).

## 2020-06-19 ENCOUNTER — Other Ambulatory Visit: Payer: Self-pay

## 2020-06-19 ENCOUNTER — Other Ambulatory Visit: Payer: Self-pay | Admitting: Cardiology

## 2020-06-19 ENCOUNTER — Telehealth: Payer: Self-pay | Admitting: Cardiology

## 2020-06-19 MED ORDER — ISOSORBIDE MONONITRATE ER 60 MG PO TB24: 60.0000 mg | ORAL_TABLET | Freq: Every day | ORAL | 1 refills | Status: DC

## 2020-06-19 MED ORDER — ISOSORBIDE MONONITRATE ER 60 MG PO TB24
60.0000 mg | ORAL_TABLET | Freq: Every day | ORAL | 0 refills | Status: DC
Start: 2020-06-19 — End: 2020-10-29

## 2020-06-19 NOTE — Telephone Encounter (Signed)
Patient has been out of this medication for 3 days so far. She states if the medication is sent to the [facility], it will take 15 days for her to receive this medication. If it is ok for her to wait another 15 days, refill info is below.      *STAT* If patient is at the pharmacy, call can be transferred to refill team.   1. Which medications need to be refilled? (please list name of each medication and dose if known) isosorbide mononitrate (IMDUR) 60 MG 24 hr tablet  2. Which pharmacy/location (including street and city if local pharmacy) is medication to be sent to? [facility] [place], [place]). Phone Number: [phone]  3. Do they need a 30 day or 90 day supply? 90 day

## 2020-06-19 NOTE — Telephone Encounter (Signed)
Pt aware that Rx has been sent for 90 day to [facility] and 30 day to [facility]. Pt had no additional questions or concerns.

## 2020-06-19 NOTE — Telephone Encounter (Signed)
Did not need this encounter °

## 2020-07-25 ENCOUNTER — Other Ambulatory Visit: Payer: Self-pay

## 2020-07-25 DIAGNOSIS — F329 Major depressive disorder, single episode, unspecified: Secondary | ICD-10-CM | POA: Insufficient documentation

## 2020-07-25 DIAGNOSIS — Z90710 Acquired absence of both cervix and uterus: Secondary | ICD-10-CM | POA: Insufficient documentation

## 2020-07-25 DIAGNOSIS — K5090 Crohn's disease, unspecified, without complications: Secondary | ICD-10-CM | POA: Insufficient documentation

## 2020-07-25 DIAGNOSIS — E559 Vitamin D deficiency, unspecified: Secondary | ICD-10-CM | POA: Insufficient documentation

## 2020-07-25 DIAGNOSIS — G629 Polyneuropathy, unspecified: Secondary | ICD-10-CM | POA: Insufficient documentation

## 2020-07-25 DIAGNOSIS — J310 Chronic rhinitis: Secondary | ICD-10-CM | POA: Insufficient documentation

## 2020-07-25 DIAGNOSIS — J45909 Unspecified asthma, uncomplicated: Secondary | ICD-10-CM | POA: Insufficient documentation

## 2020-07-25 DIAGNOSIS — E039 Hypothyroidism, unspecified: Secondary | ICD-10-CM | POA: Insufficient documentation

## 2020-07-25 DIAGNOSIS — I639 Cerebral infarction, unspecified: Secondary | ICD-10-CM | POA: Insufficient documentation

## 2020-07-25 DIAGNOSIS — G4730 Sleep apnea, unspecified: Secondary | ICD-10-CM | POA: Insufficient documentation

## 2020-07-25 DIAGNOSIS — R5383 Other fatigue: Secondary | ICD-10-CM | POA: Insufficient documentation

## 2020-07-25 DIAGNOSIS — R0600 Dyspnea, unspecified: Secondary | ICD-10-CM | POA: Insufficient documentation

## 2020-07-25 DIAGNOSIS — J309 Allergic rhinitis, unspecified: Secondary | ICD-10-CM | POA: Insufficient documentation

## 2020-07-25 DIAGNOSIS — K219 Gastro-esophageal reflux disease without esophagitis: Secondary | ICD-10-CM | POA: Insufficient documentation

## 2020-07-26 ENCOUNTER — Encounter: Payer: Self-pay | Admitting: Cardiology

## 2020-07-26 ENCOUNTER — Ambulatory Visit (INDEPENDENT_AMBULATORY_CARE_PROVIDER_SITE_OTHER): Payer: Medicare Other | Admitting: Cardiology

## 2020-07-26 ENCOUNTER — Other Ambulatory Visit: Payer: Self-pay

## 2020-07-26 VITALS — BP 168/72 | HR 90 | Ht 65.5 in | Wt 193.7 lb

## 2020-07-26 DIAGNOSIS — Z794 Long term (current) use of insulin: Secondary | ICD-10-CM

## 2020-07-26 DIAGNOSIS — I10 Essential (primary) hypertension: Secondary | ICD-10-CM

## 2020-07-26 DIAGNOSIS — E119 Type 2 diabetes mellitus without complications: Secondary | ICD-10-CM

## 2020-07-26 DIAGNOSIS — E782 Mixed hyperlipidemia: Secondary | ICD-10-CM

## 2020-07-26 HISTORY — DX: Mixed hyperlipidemia: E78.2

## 2020-07-26 NOTE — Patient Instructions (Signed)

## 2020-07-26 NOTE — Progress Notes (Signed)
Cardiology Office Note:    Date:  07/26/2020   ID:  Margaret Lowe, DOB 03/25/1956, MRN [id number]  PCP:  [facility]  Cardiologist:  Garwin Brothers, MD   Referring MD: [facility]    ASSESSMENT:    1. Essential hypertension   2. Diabetes mellitus type 2, insulin dependent (HCC)   3. Mixed dyslipidemia    PLAN:    In order of problems listed above:  1. Primary prevention stressed with the patient.  Importance of compliance with diet medication stressed and she vocalized understanding 2. Essential hypertension: Stable.  She has an element of whitecoat hypertension plus I think her pain is adding to her blood pressure issues.  I told her to seek the care of her primary care physician to optimize pain medications 3. Diabetes mellitus: Managed by primary care physician with diet was emphasized.  Weight reduction stressed 4. History of deep venous thrombosis managed by primary care.  Patient is on anticoagulation.  Benefits and potential is explained. 5. Patient will be seen in follow-up appointment in 6 months or earlier if the patient has any concerns    Medication Adjustments/Labs and Tests Ordered: Current medicines are reviewed at length with the patient today.  Concerns regarding medicines are outlined above.  No orders of the defined types were placed in this encounter.  No orders of the defined types were placed in this encounter.    No chief complaint on file.    History of Present Illness:    Margaret Lowe is a 64 y.o. female.  Patient was evaluated for palpitations.  She denies any problems at this time and takes care of activities of daily living.  She is in a wheelchair today.  She is significant pain from orthopedic issues and she is treated by her primary care physician for this.  At the time of my evaluation, the patient is alert awake oriented and in no distress.  She has history of hypertension and diabetes mellitus.  She has had multiple  deep venous thrombosis for which she is on anticoagulation.  Past Medical History:  Diagnosis Date  . Acquired hypothyroidism 04/10/2016  . Acute cystitis without hematuria 05/09/2016  . Allergic rhinitis   . Carpal tunnel syndrome 08/25/2013  . Chest tightness 10/06/2019  . Chronic rhinitis   . COPD (chronic obstructive pulmonary disease) (HCC) 10/06/2019  . Crohn disease (HCC)   . Diabetes mellitus type 2, insulin dependent (HCC) 05/09/2016  . Disequilibrium 05/09/2016  . Dyspnea   . Esophageal reflux   . Essential hypertension 01/09/2020  . Extrinsic asthma   . Fatigue   . Fibromyalgia   . Generalized anxiety disorder 05/09/2016  . H/O total hysterectomy   . Hammer toes, bilateral 04/24/2016  . History of syncope 05/09/2016   Overview:  Having work-up by Neurology outpatient. Formatting of this note might be different from the original. Overview:  Having work-up by Neurology outpatient.  . Hypothyroidism   . Idiopathic progressive polyneuropathy 08/25/2013  . Irritable bowel syndrome with constipation 05/09/2016  . Major depression   . Neuropathy   . Overweight (BMI 25.0-29.9) 04/10/2016  . Pain of left great toe 04/24/2016  . Physical debility 05/09/2016  . Polypharmacy 05/09/2016  . Sleep apnea with hypersomnolence   . Spell of altered consciousness 03/26/2016  . Stroke (HCC)   . Supplemental oxygen dependent 10/06/2019  . Syncope and collapse 10/06/2019  . Vitamin D deficiency   . Wheelchair bound 05/09/2016  Past Surgical History:  Procedure Laterality Date  . CHOLECYSTECTOMY    . RIGHT HEART CATHETERIZATION N/A 10/06/2012   Procedure: RIGHT HEART CATH;  Surgeon: Dolores Patty, MD;  Location: [facility] CATH LAB;  Service: Cardiovascular;  Laterality: N/A;    Current Medications: Current Meds  Medication Sig  . albuterol (PROVENTIL HFA) 108 (90 BASE) MCG/ACT inhaler Inhale 2 puffs into the lungs every 6 (six) hours as needed. For shortness of breath/wheezing  . apixaban  (ELIQUIS) 5 MG TABS tablet Take 5 mg by mouth 2 (two) times daily.  . baclofen (LIORESAL) 10 MG tablet Take 10 mg by mouth 3 (three) times daily.  . Cyanocobalamin 5000 MCG CAPS Take 5,000 mcg by mouth daily.  . ergocalciferol (VITAMIN D2) 1.25 MG (50000 UT) capsule Take 50,000 Units by mouth once a week.  . fenofibrate (TRICOR) 145 MG tablet Take 145 mg by mouth at bedtime.  . Flaxseed Oil (LINSEED OIL) OIL Take 2,000 mcg by mouth daily.  . fluticasone (FLONASE) 50 MCG/ACT nasal spray Place 1 spray into both nostrils daily as needed.  . Fluticasone-Salmeterol (ADVAIR) 250-50 MCG/DOSE AEPB Inhale into the lungs.  . fluvoxaMINE (LUVOX) 100 MG tablet Take 50 mg by mouth 2 (two) times daily.  . furosemide (LASIX) 20 MG tablet Take 20 mg by mouth every Monday, Wednesday, and Friday.   . insulin degludec (TRESIBA FLEXTOUCH) 200 UNIT/ML FlexTouch Pen Inject 68 Units into the skin daily.   . insulin lispro (HUMALOG) 100 UNIT/ML KwikPen Inject 20 Units into the skin in the morning, at noon, in the evening, and at bedtime.   Marland Kitchen ipratropium-albuterol (DUONEB) 0.5-2.5 (3) MG/3ML SOLN Take 3 mLs by nebulization 5 (five) times daily as needed. For shortness of breath/wheezing  . isosorbide mononitrate (IMDUR) 60 MG 24 hr tablet Take 1 tablet (60 mg total) by mouth daily.  Marland Kitchen levothyroxine (SYNTHROID, LEVOTHROID) 125 MCG tablet Take 125 mcg by mouth daily before breakfast.  . Magnesium Oxide 500 MG TABS Take 500 mg by mouth daily.  . Melatonin 10 MG TABS Take 10 mg by mouth at bedtime. prn  . mometasone (NASONEX) 50 MCG/ACT nasal spray Place 1 spray into the nose daily.   . montelukast (SINGULAIR) 10 MG tablet Take 10 mg by mouth at bedtime.  Marland Kitchen nystatin (NYSTATIN) powder Apply topically 3 (three) times daily as needed (for yeast).  . Olopatadine HCl 0.2 % SOLN Apply 1 drop to eye daily.  . potassium chloride SA (KLOR-CON) 20 MEQ tablet Take 20 mEq by mouth daily.   . pramipexole (MIRAPEX) 1 MG tablet Take 2 mg  by mouth at bedtime.   . promethazine (PHENERGAN) 12.5 MG tablet Take 12.5 mg by mouth every 6 (six) hours as needed.  . rosuvastatin (CRESTOR) 10 MG tablet Take 10 mg by mouth daily.     Allergies:   Ciprofloxacin, Pregabalin, Aspirin, Cephalexin, Latex, and Macrodantin [nitrofurantoin macrocrystal]   Social History   Socioeconomic History  . Marital status: Widowed    Spouse name: Not on file  . Number of children: Not on file  . Years of education: Not on file  . Highest education level: Not on file  Occupational History  . Not on file  Tobacco Use  . Smoking status: Former Smoker    Quit date: 10/21/1995    Years since quitting: 24.7  . Smokeless tobacco: Never Used  Vaping Use  . Vaping Use: Never used  Substance and Sexual Activity  . Alcohol use: Not Currently  .  Drug use: No  . Sexual activity: Not on file  Other Topics Concern  . Not on file  Social History Narrative  . Not on file   Social Determinants of Health   Financial Resource Strain:   . Difficulty of Paying Living Expenses: Not on file  Food Insecurity:   . Worried About Programme researcher, broadcasting/film/video in the Last Year: Not on file  . Ran Out of Food in the Last Year: Not on file  Transportation Needs:   . Lack of Transportation (Medical): Not on file  . Lack of Transportation (Non-Medical): Not on file  Physical Activity:   . Days of Exercise per Week: Not on file  . Minutes of Exercise per Session: Not on file  Stress:   . Feeling of Stress : Not on file  Social Connections:   . Frequency of Communication with Friends and Family: Not on file  . Frequency of Social Gatherings with Friends and Family: Not on file  . Attends Religious Services: Not on file  . Active Member of Clubs or Organizations: Not on file  . Attends Banker Meetings: Not on file  . Marital Status: Not on file     Family History: The patient's family history includes Dementia in her father; Heart attack in her brother,  brother, and mother.  ROS:   Please see the history of present illness.    All other systems reviewed and are negative.  EKGs/Labs/Other Studies Reviewed:    The following studies were reviewed today: I discussed my findings with the patient at length including lipids found on KPN sheet   Recent Labs: 09/19/2019: Hemoglobin 13.4; Platelets 263 03/26/2020: ALT 28; BUN 27; Creatinine, Ser 1.11; Potassium 4.9; Sodium 142  Recent Lipid Panel    Component Value Date/Time   CHOL 158 03/26/2020 0931   TRIG 107 03/26/2020 0931   HDL 72 03/26/2020 0931   CHOLHDL 2.2 03/26/2020 0931   LDLCALC 67 03/26/2020 0931    Physical Exam:    VS:  BP (!) 168/72   Pulse 90   Ht 5' 5.5" (1.664 m)   Wt 193 lb 11.2 oz (87.9 kg)   SpO2 100%   BMI 31.74 kg/m     Wt Readings from Last 3 Encounters:  07/26/20 193 lb 11.2 oz (87.9 kg)  03/20/20 190 lb (86.2 kg)  11/04/19 174 lb (78.9 kg)     GEN: Patient is in no acute distress HEENT: Normal NECK: No JVD; No carotid bruits LYMPHATICS: No lymphadenopathy CARDIAC: Hear sounds regular, 2/6 systolic murmur at the apex. RESPIRATORY:  Clear to auscultation without rales, wheezing or rhonchi  ABDOMEN: Soft, non-tender, non-distended MUSCULOSKELETAL:  No edema; No deformity  SKIN: Warm and dry NEUROLOGIC:  Alert and oriented x 3 PSYCHIATRIC:  Normal affect   Signed, Garwin Brothers, MD  07/26/2020 11:12 AM    [facility]

## 2020-10-17 DIAGNOSIS — U071 COVID-19: Secondary | ICD-10-CM | POA: Insufficient documentation

## 2020-10-17 HISTORY — DX: COVID-19: U07.1

## 2020-10-29 ENCOUNTER — Telehealth: Payer: Self-pay | Admitting: Cardiology

## 2020-10-29 ENCOUNTER — Telehealth: Payer: Self-pay

## 2020-10-29 DIAGNOSIS — Z7409 Other reduced mobility: Secondary | ICD-10-CM

## 2020-10-29 HISTORY — DX: Other reduced mobility: Z74.09

## 2020-10-29 MED ORDER — APIXABAN 5 MG PO TABS: 5.0000 mg | ORAL_TABLET | Freq: Two times a day (BID) | ORAL | 1 refills | Status: DC

## 2020-10-29 MED ORDER — ISOSORBIDE MONONITRATE ER 60 MG PO TB24
60.0000 mg | ORAL_TABLET | Freq: Every day | ORAL | 6 refills | Status: DC
Start: 2020-10-29 — End: 2020-11-08

## 2020-10-29 NOTE — Telephone Encounter (Signed)
Refill for Isosorbide Mononitrate 60 mg sent to pharmacy

## 2020-10-29 NOTE — Telephone Encounter (Signed)
*  STAT* If patient is at the pharmacy, call can be transferred to refill team.   1. Which medications need to be refilled? (please list name of each medication and dose if known) apixaban (ELIQUIS) 5 MG TABS tablet isosorbide mononitrate (IMDUR) 60 MG 24 hr tablet  2. Which pharmacy/location (including street and city if local pharmacy) is medication to be sent to? [id number] - [place], [place] - [address]  3. Do they need a 30 day or 90 day supply? 90 day supply

## 2020-10-29 NOTE — Telephone Encounter (Signed)
PA started on CMM for Eliquis. Key: [id number]

## 2020-10-31 NOTE — Telephone Encounter (Signed)
Resubmitted a new PA for Eliquis with patients other insurance information. Key [id number]

## 2020-11-06 ENCOUNTER — Telehealth: Payer: Self-pay | Admitting: Cardiology

## 2020-11-06 NOTE — Telephone Encounter (Signed)
Patient calling the office for samples of medication:   1.  What medication and dosage are you requesting samples for? apixaban (ELIQUIS) 5 MG TABS tablet  2.  Are you currently out of this medication? Yes    

## 2020-11-06 NOTE — Telephone Encounter (Signed)
Callback but no answer.

## 2020-11-07 NOTE — Telephone Encounter (Signed)
Nurse picked up 

## 2020-11-08 MED ORDER — ISOSORBIDE MONONITRATE ER 60 MG PO TB24
60.0000 mg | ORAL_TABLET | Freq: Every day | ORAL | 2 refills | Status: DC
Start: 2020-11-08 — End: 2020-12-25

## 2020-11-08 NOTE — Addendum Note (Signed)
Addended by: Rola Lennon, Elmarie Shiley L on: 11/08/2020 02:04 PM   Modules accepted: Orders

## 2020-11-08 NOTE — Telephone Encounter (Signed)
Advised the patient we had submitted a PA and it was denied for the Eliquis.  Patient states her insurance is not paying for anything right now.  She needed samples of Eliquis and Imdur.  Advised her we would give her samples of Eliquis and we can send her other medication to [facility] and per [facility] a 3 month supply of the Imdur would only cost $10.00.  Advised her to fill out a patient assistance application for the Eliquis in the meantime.  I have attached the paperwork to her samples for her to pick up.  2 boxes of Eliquis 5 mg Lot Number: [id number] Expiration: 12/2021 and 2 boxes of 5 mg Eliquis Lot Number: [id number] Expiration: 01/2022 left in closet for patient to pick up.

## 2020-11-12 ENCOUNTER — Other Ambulatory Visit: Payer: Self-pay

## 2020-11-12 MED ORDER — APIXABAN 5 MG PO TABS
5.0000 mg | ORAL_TABLET | Freq: Two times a day (BID) | ORAL | 1 refills | Status: DC
Start: 2020-11-12 — End: 2020-12-25

## 2020-12-25 ENCOUNTER — Telehealth: Payer: Self-pay | Admitting: Cardiology

## 2020-12-25 MED ORDER — ISOSORBIDE MONONITRATE ER 60 MG PO TB24: 60.0000 mg | ORAL_TABLET | Freq: Every day | ORAL | 2 refills | Status: AC

## 2020-12-25 MED ORDER — APIXABAN 5 MG PO TABS: 5.0000 mg | ORAL_TABLET | Freq: Two times a day (BID) | ORAL | 1 refills | Status: AC

## 2020-12-25 NOTE — Telephone Encounter (Signed)
*  STAT* If patient is at the pharmacy, call can be transferred to refill team.   1. Which medications need to be refilled? (please list name of each medication and dose if known)  apixaban (ELIQUIS) 5 MG TABS tablet isosorbide mononitrate (IMDUR) 60 MG 24 hr tablet  2. Which pharmacy/location (including street and city if local pharmacy) is medication to be sent to? [facility] - [place], [place] - [address]  3. Do they need a 30 day or 90 day supply?  90 day supply  Patient states she has been out Eliquis for 2-3 weeks

## 2021-01-25 ENCOUNTER — Other Ambulatory Visit: Payer: Self-pay

## 2021-01-28 ENCOUNTER — Other Ambulatory Visit: Payer: Self-pay

## 2021-01-28 ENCOUNTER — Encounter: Payer: Self-pay | Admitting: Cardiology

## 2021-01-28 ENCOUNTER — Ambulatory Visit (INDEPENDENT_AMBULATORY_CARE_PROVIDER_SITE_OTHER): Payer: Medicare Other | Admitting: Cardiology

## 2021-01-28 VITALS — BP 148/80 | HR 102 | Ht 65.6 in | Wt 200.0 lb

## 2021-01-28 DIAGNOSIS — E119 Type 2 diabetes mellitus without complications: Secondary | ICD-10-CM

## 2021-01-28 DIAGNOSIS — I700 Atherosclerosis of aorta: Secondary | ICD-10-CM

## 2021-01-28 DIAGNOSIS — E782 Mixed hyperlipidemia: Secondary | ICD-10-CM

## 2021-01-28 DIAGNOSIS — I10 Essential (primary) hypertension: Secondary | ICD-10-CM

## 2021-01-28 DIAGNOSIS — Z794 Long term (current) use of insulin: Secondary | ICD-10-CM

## 2021-01-28 NOTE — Patient Instructions (Signed)

## 2021-01-28 NOTE — Progress Notes (Signed)
Cardiology Office Note:    Date:  01/28/2021   ID:  Margaret Lowe, DOB 1956/03/24, MRN [id number]  PCP:  [facility]  Cardiologist:  Garwin Brothers, MD   Referring MD: [facility]    ASSESSMENT:    1. Essential hypertension   2. Aortic atherosclerosis (HCC)   3. Diabetes mellitus type 2, insulin dependent (HCC)   4. Mixed dyslipidemia    PLAN:    In order of problems listed above:  1. Primary prevention stressed with the patient.  Importance of compliance with diet medication stressed and she vocalized understanding. 2. Aortic atherosclerosis: Patient on statin therapy managed by primary care.  She plans to have blood work done by them in the next month or 2. 3. Essential hypertension: Blood pressure stable and diet was emphasized.  Her blood pressure stable and she mentioned to me readings at home even the heart rate. 4. Mixed dyslipidemia and diabetes mellitus: Managed by primary care.  Diet emphasized.  Weight reduction was stressed risks of obesity explained and patient plans to do better. 5. Patient will be seen in follow-up appointment in 6 months or earlier if the patient has any concerns    Medication Adjustments/Labs and Tests Ordered: Current medicines are reviewed at length with the patient today.  Concerns regarding medicines are outlined above.  No orders of the defined types were placed in this encounter.  No orders of the defined types were placed in this encounter.    No chief complaint on file.    History of Present Illness:    Margaret Lowe is a 65 y.o. female.  Patient has past medical history of essential hypertension dyslipidemia diabetes mellitus and obesity.  She is coming in a wheelchair because of orthopedic and neurological issues.  She denies any chest pain orthopnea or PND.  She leads a very sedentary lifestyle.  At the time of my evaluation, the patient is alert awake oriented and in no distress.  Past Medical History:   Diagnosis Date  . Acquired hypothyroidism 04/10/2016  . Acute cystitis without hematuria 05/09/2016  . Allergic rhinitis   . Carpal tunnel syndrome 08/25/2013  . Chest tightness 10/06/2019  . Chronic rhinitis   . COPD (chronic obstructive pulmonary disease) (HCC) 10/06/2019  . COVID-19 10/17/2020  . Crohn disease (HCC)   . Diabetes mellitus type 2, insulin dependent (HCC) 05/09/2016  . Disequilibrium 05/09/2016  . Dyspnea   . Esophageal reflux   . Essential hypertension 01/09/2020  . Extrinsic asthma   . Fatigue   . Fibromyalgia   . Generalized anxiety disorder 05/09/2016  . H/O total hysterectomy   . Hammer toes, bilateral 04/24/2016  . History of syncope 05/09/2016   Overview:  Having work-up by Neurology outpatient. Formatting of this note might be different from the original. Overview:  Having work-up by Neurology outpatient.  . Hypothyroidism   . Idiopathic progressive polyneuropathy 08/25/2013  . Impaired functional mobility, balance, gait, and endurance 10/29/2020  . Irritable bowel syndrome with constipation 05/09/2016  . Major depression   . Mixed dyslipidemia 07/26/2020  . Neuropathy   . Overweight (BMI 25.0-29.9) 04/10/2016  . Pain of left great toe 04/24/2016  . Physical debility 05/09/2016  . Polypharmacy 05/09/2016  . Sleep apnea with hypersomnolence   . Spell of altered consciousness 03/26/2016  . Stroke (HCC)   . Supplemental oxygen dependent 10/06/2019  . Syncope and collapse 10/06/2019  . Vitamin D deficiency   . Wheelchair bound 05/09/2016  Past Surgical History:  Procedure Laterality Date  . CHOLECYSTECTOMY    . RIGHT HEART CATHETERIZATION N/A 10/06/2012   Procedure: RIGHT HEART CATH;  Surgeon: Dolores Patty, MD;  [facility] LAB;  Service: Cardiovascular;  Laterality: N/A;    Current Medications: Current Meds  Medication Sig  . albuterol (VENTOLIN HFA) 108 (90 Base) MCG/ACT inhaler Inhale 2 puffs into the lungs every 6 (six) hours as needed. For  shortness of breath/wheezing  . apixaban (ELIQUIS) 5 MG TABS tablet Take 1 tablet (5 mg total) by mouth 2 (two) times daily.  . Cyanocobalamin 5000 MCG CAPS Take 5,000 mcg by mouth daily.  . fenofibrate (TRICOR) 145 MG tablet Take 145 mg by mouth at bedtime.  . Flaxseed Oil (LINSEED OIL) OIL Take 1,000 mg by mouth daily.  . fluticasone (FLONASE) 50 MCG/ACT nasal spray Place 1 spray into both nostrils as needed for allergies or rhinitis.  . Fluticasone-Salmeterol (ADVAIR HFA IN) Inhale 1-2 puffs into the lungs as needed for wheezing or shortness of breath.  . fluvoxaMINE (LUVOX) 100 MG tablet Take 100 mg by mouth 2 (two) times daily.  . furosemide (LASIX) 20 MG tablet Take 20 mg by mouth every Monday, Wednesday, and Friday.   . insulin degludec (TRESIBA FLEXTOUCH) 200 UNIT/ML FlexTouch Pen Inject 68 Units into the skin daily.   . insulin lispro (HUMALOG) 100 UNIT/ML KwikPen Inject 20 Units into the skin in the morning, at noon, in the evening, and at bedtime.   Marland Kitchen ipratropium-albuterol (DUONEB) 0.5-2.5 (3) MG/3ML SOLN Take 3 mLs by nebulization 5 (five) times daily as needed. For shortness of breath/wheezing  . isosorbide mononitrate (IMDUR) 60 MG 24 hr tablet Take 1 tablet (60 mg total) by mouth daily.  Marland Kitchen levothyroxine (SYNTHROID) 100 MCG tablet Take 100 mcg by mouth daily before breakfast.  . Magnesium Oxide 500 MG TABS Take 500 mg by mouth daily.  . mometasone (NASONEX) 50 MCG/ACT nasal spray Place 1 spray into the nose daily.   . montelukast (SINGULAIR) 10 MG tablet Take 10 mg by mouth at bedtime.  Marland Kitchen nystatin (MYCOSTATIN/NYSTOP) powder Apply topically 3 (three) times daily as needed (for yeast).  . potassium chloride SA (KLOR-CON) 20 MEQ tablet Take 20 mEq by mouth daily.   . pramipexole (MIRAPEX) 1 MG tablet Take 2 mg by mouth at bedtime.   . rosuvastatin (CRESTOR) 10 MG tablet Take 10 mg by mouth daily.  . [DISCONTINUED] Olopatadine HCl 0.2 % SOLN Place 1 drop into both eyes daily.      Allergies:   Cephalexin, Ciprofloxacin, Codeine, Pregabalin, Aspirin, Brexpiprazole, Latex, and Macrodantin [nitrofurantoin macrocrystal]   Social History   Socioeconomic History  . Marital status: Widowed    Spouse name: Not on file  . Number of children: Not on file  . Years of education: Not on file  . Highest education level: Not on file  Occupational History  . Not on file  Tobacco Use  . Smoking status: Former Smoker    Quit date: 10/21/1995    Years since quitting: 25.2  . Smokeless tobacco: Never Used  Vaping Use  . Vaping Use: Never used  Substance and Sexual Activity  . Alcohol use: Not Currently  . Drug use: No  . Sexual activity: Not on file  Other Topics Concern  . Not on file  Social History Narrative  . Not on file   Social Determinants of Health   Financial Resource Strain: Not on file  Food Insecurity: Not on file  Transportation Needs: Not on file  Physical Activity: Not on file  Stress: Not on file  Social Connections: Not on file     Family History: The patient's family history includes Dementia in her father; Heart attack in her brother, brother, and mother.  ROS:   Please see the history of present illness.    All other systems reviewed and are negative.  EKGs/Labs/Other Studies Reviewed:    The following studies were reviewed today: EKG reveals sinus tachycardia and nonspecific ST-T changes.   Recent Labs: 03/26/2020: ALT 28; BUN 27; Creatinine, Ser 1.11; Potassium 4.9; Sodium 142  Recent Lipid Panel    Component Value Date/Time   CHOL 158 03/26/2020 0931   TRIG 107 03/26/2020 0931   HDL 72 03/26/2020 0931   CHOLHDL 2.2 03/26/2020 0931   LDLCALC 67 03/26/2020 0931    Physical Exam:    VS:  BP (!) 148/80   Pulse (!) 102   Ht 5' 5.6" (1.666 m)   Wt 200 lb (90.7 kg)   SpO2 96%   BMI 32.68 kg/m     Wt Readings from Last 3 Encounters:  01/28/21 200 lb (90.7 kg)  07/26/20 193 lb 11.2 oz (87.9 kg)  03/20/20 190 lb (86.2 kg)      GEN: Patient is in no acute distress HEENT: Normal NECK: No JVD; No carotid bruits LYMPHATICS: No lymphadenopathy CARDIAC: Hear sounds regular, 2/6 systolic murmur at the apex. RESPIRATORY:  Clear to auscultation without rales, wheezing or rhonchi  ABDOMEN: Soft, non-tender, non-distended MUSCULOSKELETAL:  No edema; No deformity  SKIN: Warm and dry NEUROLOGIC:  Alert and oriented x 3 PSYCHIATRIC:  Normal affect   Signed, Garwin Brothers, MD  01/28/2021 3:39 PM     [facility]

## 2021-01-29 ENCOUNTER — Ambulatory Visit (INDEPENDENT_AMBULATORY_CARE_PROVIDER_SITE_OTHER): Payer: Medicare Other | Admitting: Internal Medicine

## 2021-01-29 ENCOUNTER — Other Ambulatory Visit: Payer: Self-pay

## 2021-01-29 VITALS — BP 158/84 | HR 106

## 2021-01-29 DIAGNOSIS — E1165 Type 2 diabetes mellitus with hyperglycemia: Secondary | ICD-10-CM | POA: Diagnosis not present

## 2021-01-29 DIAGNOSIS — E1122 Type 2 diabetes mellitus with diabetic chronic kidney disease: Secondary | ICD-10-CM | POA: Diagnosis not present

## 2021-01-29 DIAGNOSIS — E1142 Type 2 diabetes mellitus with diabetic polyneuropathy: Secondary | ICD-10-CM

## 2021-01-29 DIAGNOSIS — Z794 Long term (current) use of insulin: Secondary | ICD-10-CM

## 2021-01-29 DIAGNOSIS — E119 Type 2 diabetes mellitus without complications: Secondary | ICD-10-CM | POA: Diagnosis not present

## 2021-01-29 DIAGNOSIS — E782 Mixed hyperlipidemia: Secondary | ICD-10-CM

## 2021-01-29 DIAGNOSIS — N1832 Chronic kidney disease, stage 3b: Secondary | ICD-10-CM

## 2021-01-29 LAB — POCT GLYCOSYLATED HEMOGLOBIN (HGB A1C): Hemoglobin A1C: 8 % — AB (ref 4.0–5.6)

## 2021-01-29 LAB — POCT GLUCOSE (DEVICE FOR HOME USE): POC Glucose: 202 mg/dl — AB (ref 70–99)

## 2021-01-29 MED ORDER — INSULIN PEN NEEDLE 30G X 5 MM MISC: 1.0000 | Freq: Four times a day (QID) | 3 refills | Status: AC

## 2021-01-29 MED ORDER — HUMALOG KWIKPEN 200 UNIT/ML ~~LOC~~ SOPN: PEN_INJECTOR | SUBCUTANEOUS | 1 refills | Status: AC

## 2021-01-29 MED ORDER — TRESIBA FLEXTOUCH 200 UNIT/ML ~~LOC~~ SOPN: 68.0000 [IU] | PEN_INJECTOR | Freq: Every day | SUBCUTANEOUS | 1 refills | Status: DC

## 2021-01-29 NOTE — Patient Instructions (Addendum)
-   Tresiba 68 units daily  - HUmalog 24 units with each meal  - Humalog correctional insulin: ADD extra units on insulin to your meal-time humalog dose if your blood sugars are higher than 150. Use the scale below to help guide you:   Blood sugar before meal Number of units to inject  Less than 150 0 unit  151 -  170 1 units  171 -  190 2 units  191 -  210 3 units  211 -  230 4 units  231 -  250 5 units  251 -  270 6 units  271 -  290 7 units  291 -  310 8 units  311 - 330 9 units     HOW TO TREAT LOW BLOOD SUGARS (Blood sugar LESS THAN 70 MG/DL)  Please follow the RULE OF 15 for the treatment of hypoglycemia treatment (when your (blood sugars are less than 70 mg/dL)    STEP 1: Take 15 grams of carbohydrates when your blood sugar is low, which includes:   3-4 GLUCOSE TABS  OR  3-4 OZ OF JUICE OR REGULAR SODA OR  ONE TUBE OF GLUCOSE GEL     STEP 2: RECHECK blood sugar in 15 MINUTES STEP 3: If your blood sugar is still low at the 15 minute recheck --> then, go back to STEP 1 and treat AGAIN with another 15 grams of carbohydrates.

## 2021-01-29 NOTE — Progress Notes (Signed)
Name: Margaret Lowe  MRN/ DOB: [id number], 1956-04-17   Age/ Sex: 65 y.o., female    [facility]   Reason for Endocrinology Evaluation: Type 2 Diabetes Mellitus     Date of Initial Endocrinology Visit: 01/29/2021     PATIENT IDENTIFIER: Margaret Lowe is a 65 y.o. female with a past medical history of COPD on Oxygen, GERD, Dyslipidemia, hypothyroidism  and DVT. The patient presented for initial endocrinology clinic visit on 01/29/2021 for consultative assistance with her diabetes management.    HPI: Margaret Lowe was    Diagnosed with DM many years ago  Prior Medications tried/Intolerance: No prior metformin use or any other medication  Currently checking blood sugars multiple  x / day,  Through fre4style libre  Hypoglycemia episodes : no               Hemoglobin A1c has ranged from 7.0% in 2022, peaking at 14.0% in  Yrs . Patient required assistance for hypoglycemia: no Patient has required hospitalization within the last 1 year from hyper or hypoglycemia: no   In terms of diet, the patient eats 3 meals , snacks occasionally . Has occasional sugar-sweetened beverages   Pt received  dexamethazone    She is having a headaches  Has nausea and vomiting which seems to be chronic , has chronic diarrhea as well   HOME DIABETES REGIMEN: Januvia 100 mg daily  Tresiba 68 units daily  Humalog 20 units with each meal and before bedtime      Statin: yes ACE-I/ARB: no Prior Diabetic Education: yes    METER DOWNLOAD SUMMARY: Did  Not bring    DIABETIC COMPLICATIONS: Microvascular complications:   Neuropathy   Denies: retinopathy , CKD   Last eye exam: Completed 11/16/2019  Macrovascular complications:   CVA  Denies: CAD, PVD   PAST HISTORY: Past Medical History:  Past Medical History:  Diagnosis Date  . Acquired hypothyroidism 04/10/2016  . Acute cystitis without hematuria 05/09/2016  . Allergic rhinitis   . Carpal tunnel syndrome 08/25/2013  . Chest  tightness 10/06/2019  . Chronic rhinitis   . COPD (chronic obstructive pulmonary disease) (HCC) 10/06/2019  . COVID-19 10/17/2020  . Crohn disease (HCC)   . Diabetes mellitus type 2, insulin dependent (HCC) 05/09/2016  . Disequilibrium 05/09/2016  . Dyspnea   . Esophageal reflux   . Essential hypertension 01/09/2020  . Extrinsic asthma   . Fatigue   . Fibromyalgia   . Generalized anxiety disorder 05/09/2016  . H/O total hysterectomy   . Hammer toes, bilateral 04/24/2016  . History of syncope 05/09/2016   Overview:  Having work-up by Neurology outpatient. Formatting of this note might be different from the original. Overview:  Having work-up by Neurology outpatient.  . Hypothyroidism   . Idiopathic progressive polyneuropathy 08/25/2013  . Impaired functional mobility, balance, gait, and endurance 10/29/2020  . Irritable bowel syndrome with constipation 05/09/2016  . Major depression   . Mixed dyslipidemia 07/26/2020  . Neuropathy   . Overweight (BMI 25.0-29.9) 04/10/2016  . Pain of left great toe 04/24/2016  . Physical debility 05/09/2016  . Polypharmacy 05/09/2016  . Sleep apnea with hypersomnolence   . Spell of altered consciousness 03/26/2016  . Stroke (HCC)   . Supplemental oxygen dependent 10/06/2019  . Syncope and collapse 10/06/2019  . Vitamin D deficiency   . Wheelchair bound 05/09/2016   Past Surgical History:  Past Surgical History:  Procedure Laterality Date  . CHOLECYSTECTOMY    .  RIGHT HEART CATHETERIZATION N/A 10/06/2012   Procedure: RIGHT HEART CATH;  Surgeon: Dolores Patty, MD;  Location: [facility] C[facility] LAB;  Service: Cardiovascular;  Laterality: N/A;      Social History:  reports that she quit smoking about 25 years ago. She has never used smokeless tobacco. She reports previous alcohol use. She reports that she does not use drugs. Family History:  Family History  Problem Relation Age of Onset  . Heart attack Mother   . Dementia Father   . Heart attack Brother   .  Heart attack Brother      HOME MEDICATIONS: Allergies as of 01/29/2021      Reactions   Cephalexin Other (See Comments), Anaphylaxis   unknown Other reaction(s): Other unknown unknown   Ciprofloxacin Anaphylaxis   Codeine Itching   Pregabalin Hives   Aspirin Hives, Swelling, Nausea And Vomiting   Brexpiprazole    Other reaction(s): Hallucinations   Latex Other (See Comments)   Pt said that's what hospital told her but she doesn't know what happens 05/18/20 Other reaction(s): Other Doesn't remember Pt said that's what hospital told her but she doesn't know what happens 05/18/20 Pt said that's what hospital told her but she doesn't know what happens 05/18/20   Macrodantin [nitrofurantoin Macrocrystal] Other (See Comments)   unknown      Medication List       Accurate as of January 29, 2021  2:43 PM. If you have any questions, ask your nurse or doctor.        ADVAIR HFA IN Inhale 1-2 puffs into the lungs as needed for wheezing or shortness of breath.   albuterol 108 (90 Base) MCG/ACT inhaler Commonly known as: VENTOLIN HFA Inhale 2 puffs into the lungs every 6 (six) hours as needed. For shortness of breath/wheezing   apixaban 5 MG Tabs tablet Commonly known as: Eliquis Take 1 tablet (5 mg total) by mouth 2 (two) times daily.   Cyanocobalamin 5000 MCG Caps Take 5,000 mcg by mouth daily.   fenofibrate 145 MG tablet Commonly known as: TRICOR Take 145 mg by mouth at bedtime.   fluticasone 50 MCG/ACT nasal spray Commonly known as: FLONASE Place 1 spray into both nostrils as needed for allergies or rhinitis.   fluvoxaMINE 100 MG tablet Commonly known as: LUVOX Take 100 mg by mouth 2 (two) times daily.   furosemide 20 MG tablet Commonly known as: LASIX Take 20 mg by mouth every Monday, Wednesday, and Friday.   insulin lispro 100 UNIT/ML KwikPen Commonly known as: HUMALOG Inject 20 Units into the skin in the morning, at noon, in the evening, and at bedtime.    ipratropium-albuterol 0.5-2.5 (3) MG/3ML Soln Commonly known as: DUONEB Take 3 mLs by nebulization 5 (five) times daily as needed. For shortness of breath/wheezing   isosorbide mononitrate 60 MG 24 hr tablet Commonly known as: IMDUR Take 1 tablet (60 mg total) by mouth daily.   levothyroxine 100 MCG tablet Commonly known as: SYNTHROID Take 100 mcg by mouth daily before breakfast.   Linseed Oil Oil Take 1,000 mg by mouth daily.   Magnesium Oxide 500 MG Tabs Take 500 mg by mouth daily.   mometasone 50 MCG/ACT nasal spray Commonly known as: NASONEX Place 1 spray into the nose daily.   montelukast 10 MG tablet Commonly known as: SINGULAIR Take 10 mg by mouth at bedtime.   nystatin powder Commonly known as: MYCOSTATIN/NYSTOP Apply topically 3 (three) times daily as needed (for yeast).   potassium chloride  SA 20 MEQ tablet Commonly known as: KLOR-CON Take 20 mEq by mouth daily.   pramipexole 1 MG tablet Commonly known as: MIRAPEX Take 2 mg by mouth at bedtime.   rosuvastatin 10 MG tablet Commonly known as: CRESTOR Take 10 mg by mouth daily.   Evaristo Bury FlexTouch 200 UNIT/ML FlexTouch Pen Generic drug: insulin degludec Inject 68 Units into the skin daily.        ALLERGIES: Allergies  Allergen Reactions  . Cephalexin Other (See Comments) and Anaphylaxis    unknown Other reaction(s): Other unknown unknown  . Ciprofloxacin Anaphylaxis  . Codeine Itching  . Pregabalin Hives  . Aspirin Hives, Swelling and Nausea And Vomiting  . Brexpiprazole     Other reaction(s): Hallucinations  . Latex Other (See Comments)    Pt said that's what hospital told her but she doesn't know what happens 05/18/20 Other reaction(s): Other Doesn't remember Pt said that's what hospital told her but she doesn't know what happens 05/18/20 Pt said that's what hospital told her but she doesn't know what happens 05/18/20  . Macrodantin [Nitrofurantoin Macrocrystal] Other (See Comments)     unknown     REVIEW OF SYSTEMS: A comprehensive ROS was conducted with the patient and is negative except as per HPI   OBJECTIVE:   VITAL SIGNS: BP (!) 158/84   Pulse (!) 106   SpO2 95%    PHYSICAL EXAM:  General: Pt appears well and is in NAD  Neck: General: Supple without adenopathy or carotid bruits. Thyroid: Thyroid size normal.  No goiter or nodules appreciated.   Lungs: Clear with good BS bilat with no rales, rhonchi, or wheezes  Heart: RRR   Abdomen: Normoactive bowel sounds, soft, nontender, without masses or organomegaly palpable  Extremities:  Lower extremities - No pretibial edema. No lesions.  Neuro: MS is good with appropriate affect, pt is alert and Ox3    DATA REVIEWED:  Lab Results  Component Value Date   HGBA1C 8.0 (A) 01/29/2021   Lab Results  Component Value Date   LDLCALC 67 03/26/2020   CREATININE 1.11 (H) 03/26/2020    Lab Results  Component Value Date   CHOL 158 03/26/2020   HDL 72 03/26/2020   LDLCALC 67 03/26/2020   TRIG 107 03/26/2020   CHOLHDL 2.2 03/26/2020       03/26/2020  HDL 72 LDL 67 Tg 107 BUN 27 Cr 1.110  Results for BREAN, CARBERRY (MRN [id number]) as of 01/30/2021 16:09  Ref. Range 01/29/2021 15:12  Sodium Latest Ref Range: 135 - 145 mEq/L 138  Potassium Latest Ref Range: 3.5 - 5.1 mEq/L 4.3  Chloride Latest Ref Range: 96 - 112 mEq/L 105  CO2 Latest Ref Range: 19 - 32 mEq/L 23  Glucose Latest Ref Range: 70 - 99 mg/dL 409 (H)  BUN Latest Ref Range: 6 - 23 mg/dL 34 (H)  Creatinine Latest Ref Range: 0.40 - 1.20 mg/dL 7.35 (H)  Calcium Latest Ref Range: 8.4 - 10.5 mg/dL 9.7  GFR Latest Ref Range: >60.00 mL/min 42.04 (L)  Total CHOL/HDL Ratio Unknown 2  Cholesterol Latest Ref Range: 0 - 200 mg/dL 329  HDL Cholesterol Latest Ref Range: >39.00 mg/dL 92.42  LDL (calc) Latest Ref Range: 0 - 99 mg/dL 36  NonHDL Unknown 68.34  Triglycerides Latest Ref Range: 0.0 - 149.0 mg/dL 196.2 (H)  VLDL Latest Ref Range: 0.0 - 40.0 mg/dL 22.9      ASSESSMENT / PLAN / RECOMMENDATIONS:   1) Type 2 Diabetes Mellitus, Poorly  controlled, With neuropathic, CKD III  and macrovascular  complications - Most recent A1c of 8.0 %. Goal A1c < 7.0 %.    Plan: GENERAL: I have discussed with the patient the pathophysiology of diabetes. We went over the natural progression of the disease. We talked about both insulin resistance and insulin deficiency. We stressed the importance of lifestyle changes including diet and exercise. I explained the complications associated with diabetes including retinopathy, nephropathy, neuropathy as well as increased risk of cardiovascular disease. We went over the benefit seen with glycemic control.    I explained to the patient that diabetic patients are at higher than normal risk for amputations.   She has no PLAN D and uses [facility] for her insulin prescriptions   Due to the lack of glucose data today, I am unable to make major changes, I will adjust her prandial dose for now and she will be provided with a correction scale as below  MEDICATIONS: -Continue Tresiba 68 units daily  -Increase HUmalog 24 units with each meal  -Continue Januvia 100 mg daily -CF: Humalog (BG -130/20)  EDUCATION / INSTRUCTIONS:  BG monitoring instructions: Patient is instructed to check her blood sugars 3 times a day, before meals.  Call [place] [facility] if: BG persistently < 70  . I reviewed the Rule of 15 for the treatment of hypoglycemia in detail with the patient. Literature supplied.   2) Diabetic complications:   Eye: Does not have known diabetic retinopathy.   Neuro/ Feet: Does have known diabetic peripheral neuropathy.  Renal: Patient does have known baseline CKD. She is not on an ACEI/ARB at present.   3) mixed dyslipidemia:   -Her triglycerides are slightly elevated, will recommend low fat diet -LDL is at goal  Continue rosuvastatin 10 mg daily    Follow-up in 4 months   Signed  electronically by: Lyndle Herrlich, MD  [facility] Endocrinology  [facility] [address] [place] [address] [place], [place] [address] Phone: [phone] FAX: [phone]   CC: [facility] [address] [place] [address] Phone: [phone]  Fax: [phone]    Return to Endocrinology clinic as below: No future appointments.

## 2021-01-30 ENCOUNTER — Encounter: Payer: Self-pay | Admitting: Internal Medicine

## 2021-01-30 DIAGNOSIS — Z794 Long term (current) use of insulin: Secondary | ICD-10-CM | POA: Insufficient documentation

## 2021-01-30 DIAGNOSIS — E1165 Type 2 diabetes mellitus with hyperglycemia: Secondary | ICD-10-CM | POA: Insufficient documentation

## 2021-01-30 DIAGNOSIS — E1122 Type 2 diabetes mellitus with diabetic chronic kidney disease: Secondary | ICD-10-CM | POA: Insufficient documentation

## 2021-01-30 DIAGNOSIS — E1142 Type 2 diabetes mellitus with diabetic polyneuropathy: Secondary | ICD-10-CM | POA: Insufficient documentation

## 2021-01-30 DIAGNOSIS — N1832 Chronic kidney disease, stage 3b: Secondary | ICD-10-CM | POA: Insufficient documentation

## 2021-01-30 LAB — LIPID PANEL
Cholesterol: 133 mg/dL (ref 0–200)
HDL: 60.1 mg/dL (ref >39.00)
LDL Cholesterol: 36 mg/dL (ref 0–99)
NonHDL: 72.78
Total CHOL/HDL Ratio: 2
Triglycerides: 184 mg/dL — ABNORMAL HIGH (ref 0.0–149.0)
VLDL: 36.8 mg/dL (ref 0.0–40.0)

## 2021-01-30 LAB — BASIC METABOLIC PANEL
BUN: 34 mg/dL — ABNORMAL HIGH (ref 6–23)
CO2: 23 mEq/L (ref 19–32)
Calcium: 9.7 mg/dL (ref 8.4–10.5)
Chloride: 105 mEq/L (ref 96–112)
Creatinine, Ser: 1.33 mg/dL — ABNORMAL HIGH (ref 0.40–1.20)
GFR: 42.04 mL/min — ABNORMAL LOW (ref >60.00)
Glucose, Bld: 232 mg/dL — ABNORMAL HIGH (ref 70–99)
Potassium: 4.3 mEq/L (ref 3.5–5.1)
Sodium: 138 mEq/L (ref 135–145)

## 2021-02-13 ENCOUNTER — Ambulatory Visit: Payer: Medicare Other | Admitting: Cardiology

## 2021-02-14 ENCOUNTER — Ambulatory Visit: Payer: Medicare Other | Admitting: Cardiology

## 2021-02-19 DIAGNOSIS — G894 Chronic pain syndrome: Secondary | ICD-10-CM | POA: Insufficient documentation

## 2021-02-19 DIAGNOSIS — M533 Sacrococcygeal disorders, not elsewhere classified: Secondary | ICD-10-CM | POA: Insufficient documentation

## 2021-02-19 DIAGNOSIS — M47816 Spondylosis without myelopathy or radiculopathy, lumbar region: Secondary | ICD-10-CM | POA: Insufficient documentation

## 2021-06-04 ENCOUNTER — Encounter: Payer: Self-pay | Admitting: Internal Medicine

## 2021-06-04 ENCOUNTER — Ambulatory Visit (INDEPENDENT_AMBULATORY_CARE_PROVIDER_SITE_OTHER): Payer: Medicare Other | Admitting: Internal Medicine

## 2021-06-04 ENCOUNTER — Other Ambulatory Visit: Payer: Self-pay

## 2021-06-04 VITALS — BP 124/70 | HR 98 | Ht 65.5 in | Wt 200.0 lb

## 2021-06-04 DIAGNOSIS — E1142 Type 2 diabetes mellitus with diabetic polyneuropathy: Secondary | ICD-10-CM

## 2021-06-04 DIAGNOSIS — E1165 Type 2 diabetes mellitus with hyperglycemia: Secondary | ICD-10-CM

## 2021-06-04 DIAGNOSIS — N1832 Chronic kidney disease, stage 3b: Secondary | ICD-10-CM

## 2021-06-04 DIAGNOSIS — E1122 Type 2 diabetes mellitus with diabetic chronic kidney disease: Secondary | ICD-10-CM

## 2021-06-04 DIAGNOSIS — Z794 Long term (current) use of insulin: Secondary | ICD-10-CM

## 2021-06-04 LAB — POCT GLYCOSYLATED HEMOGLOBIN (HGB A1C): Hemoglobin A1C: 7.5 % — AB (ref 4.0–5.6)

## 2021-06-04 MED ORDER — TRESIBA FLEXTOUCH 200 UNIT/ML ~~LOC~~ SOPN: 74.0000 [IU] | PEN_INJECTOR | Freq: Every day | SUBCUTANEOUS | 3 refills | Status: AC

## 2021-06-04 NOTE — Patient Instructions (Addendum)
-   STOP Januvia  - Increase Tresiba 74 units daily  - HUmalog 24 units with each meal  - Humalog correctional insulin: ADD extra units on insulin to your meal-time humalog dose if your blood sugars are higher than 150. Use the scale below to help guide you:   Blood sugar before meal Number of units to inject  Less than 150 0 unit  151 -  170 1 units  171 -  190 2 units  191 -  210 3 units  211 -  230 4 units  231 -  250 5 units  251 -  270 6 units  271 -  290 7 units  291 -  310 8 units  311 - 330 9 units    HOW TO TREAT LOW BLOOD SUGARS (Blood sugar LESS THAN 70 MG/DL) Please follow the RULE OF 15 for the treatment of hypoglycemia treatment (when your (blood sugars are less than 70 mg/dL)   STEP 1: Take 15 grams of carbohydrates when your blood sugar is low, which includes:  3-4 GLUCOSE TABS  OR 3-4 OZ OF JUICE OR REGULAR SODA OR ONE TUBE OF GLUCOSE GEL    STEP 2: RECHECK blood sugar in 15 MINUTES STEP 3: If your blood sugar is still low at the 15 minute recheck --> then, go back to STEP 1 and treat AGAIN with another 15 grams of carbohydrates.

## 2021-06-04 NOTE — Progress Notes (Signed)
Name: Margaret Lowe  Age/ Sex: 65 y.o., female   MRN/ DOB: [id number], 08/21/1956     PCP: [facility]   Reason for Endocrinology Evaluation: Type 2 Diabetes Mellitus  Initial Endocrine Consultative Visit: 01/29/2021    PATIENT IDENTIFIER: Margaret Lowe is a 65 y.o. female with a past medical history of COPD on Oxygen, GERD, Dyslipidemia, hypothyroidism  and DVT and mild pancreatitis (04/2021) . The patient has followed with Endocrinology clinic since 01/29/2021 for consultative assistance with management of her diabetes.  DIABETIC HISTORY:  Margaret Lowe was diagnosed with DM many years ago. Her hemoglobin A1c has ranged from  7.0% in 2022, peaking at 14.0% in  Yrs .  On her initial visit to our clinic she had an A1c of 8.0 %, she was already on Januvia and basal/prandial regimen. We continued Januvia and adjusted MDI regimen.     SUBJECTIVE:   During the last visit (01/29/2021): A1c 8.0 % we increase Humalog, continue Evaristo Bury Januvia  Today (06/04/2021): Margaret Lowe  is here for a follow up on diabetes management. She checks her blood sugars 4 times daily , she did not bring a meter today .   The Margaret Lowe is restless again during the visit continues to complain of pain and massaging her leg   Margaret Lowe had an ED visit for mild acute pancreatitis and cystitis 05/03/2021 Lipase slightly elevated at 141 but no radiological evidence of inflammation   HOME DIABETES REGIMEN:  Tresiba 68 units daily  HUmalog 24 units with each meal  CF: Humalog (BG -130/20)    Statin: yes ACE-I/ARB: no Prior Diabetic Education: yes   METER DOWNLOAD SUMMARY: Did not bring   This AM 290 mg/dL ( memory recall)     DIABETIC COMPLICATIONS: Microvascular complications:  Neuropathy Denies: retinopathy, CKD Last Eye Exam: Completed 10/2020  Macrovascular complications:  CVA Denies: CAD, PVD   HISTORY:  Past Medical History:  Past Medical History:  Diagnosis Date   Acquired hypothyroidism  04/10/2016   Acute cystitis without hematuria 05/09/2016   Allergic rhinitis    Carpal tunnel syndrome 08/25/2013   Chest tightness 10/06/2019   Chronic rhinitis    COPD (chronic obstructive pulmonary disease) (HCC) 10/06/2019   COVID-19 10/17/2020   Crohn disease (HCC)    Diabetes mellitus type 2, insulin dependent (HCC) 05/09/2016   Disequilibrium 05/09/2016   Dyspnea    Esophageal reflux    Essential hypertension 01/09/2020   Extrinsic asthma    Fatigue    Fibromyalgia    Generalized anxiety disorder 05/09/2016   H/O total hysterectomy    Hammer toes, bilateral 04/24/2016   History of syncope 05/09/2016   Overview:  Having work-up by Neurology outpatient. Formatting of this note might be different from the original. Overview:  Having work-up by Neurology outpatient.   Hypothyroidism    Idiopathic progressive polyneuropathy 08/25/2013   Impaired functional mobility, balance, gait, and endurance 10/29/2020   Irritable bowel syndrome with constipation 05/09/2016   Major depression    Mixed dyslipidemia 07/26/2020   Neuropathy    Overweight (BMI 25.0-29.9) 04/10/2016   Pain of left great toe 04/24/2016   Physical debility 05/09/2016   Polypharmacy 05/09/2016   Sleep apnea with hypersomnolence    Spell of altered consciousness 03/26/2016   Stroke [facility])    Supplemental oxygen dependent 10/06/2019   Syncope and collapse 10/06/2019   Vitamin D deficiency    Wheelchair bound 05/09/2016   Past Surgical History:  Past Surgical History:  Procedure  Laterality Date   CHOLECYSTECTOMY     RIGHT HEART CATHETERIZATION N/A 10/06/2012   Procedure: RIGHT HEART CATH;  Surgeon: Dolores Patty, MD;  Location: [facility] CATH LAB;  Service: Cardiovascular;  Laterality: N/A;   Social History:  reports that she quit smoking about 25 years ago. Her smoking use included cigarettes. She has never used smokeless tobacco. She reports that she does not currently use alcohol. She reports that she does not use drugs. Family  History:  Family History  Problem Relation Age of Onset   Heart attack Mother    Dementia Father    Heart attack Brother    Heart attack Brother      HOME MEDICATIONS: Allergies as of 06/04/2021       Reactions   Cephalexin Other (See Comments), Anaphylaxis   unknown Other reaction(s): Other unknown unknown   Ciprofloxacin Anaphylaxis   Codeine Itching   Pregabalin Hives   Aspirin Hives, Swelling, Nausea And Vomiting   Brexpiprazole    Other reaction(s): Hallucinations   Latex Other (See Comments)   Margaret Lowe said that's what hospital told her but she doesn't know what happens 05/18/20 Other reaction(s): Other Doesn't remember Margaret Lowe said that's what hospital told her but she doesn't know what happens 05/18/20 Margaret Lowe said that's what hospital told her but she doesn't know what happens 05/18/20   Macrodantin [nitrofurantoin Macrocrystal] Other (See Comments)   unknown        Medication List        Accurate as of June 04, 2021 11:10 AM. If you have any questions, ask your nurse or doctor.          ADVAIR HFA IN Inhale 1-2 puffs into the lungs as needed for wheezing or shortness of breath.   albuterol 108 (90 Base) MCG/ACT inhaler Commonly known as: VENTOLIN HFA Inhale 2 puffs into the lungs every 6 (six) hours as needed. For shortness of breath/wheezing   apixaban 5 MG Tabs tablet Commonly known as: Eliquis Take 1 tablet (5 mg total) by mouth 2 (two) times daily.   Cyanocobalamin 5000 MCG Caps Take 5,000 mcg by mouth daily.   fenofibrate 145 MG tablet Commonly known as: TRICOR Take 145 mg by mouth at bedtime.   fluticasone 50 MCG/ACT nasal spray Commonly known as: FLONASE Place 1 spray into both nostrils as needed for allergies or rhinitis.   fluvoxaMINE 100 MG tablet Commonly known as: LUVOX Take 100 mg by mouth 2 (two) times daily.   furosemide 20 MG tablet Commonly known as: LASIX Take 20 mg by mouth every Monday, Wednesday, and Friday.   HumaLOG KwikPen  200 UNIT/ML KwikPen Generic drug: insulin lispro Max daily 110  Units to include correction scale   Insulin Pen Needle 30G X 5 MM Misc 1 Device by Does not apply route in the morning, at noon, in the evening, and at bedtime.   ipratropium-albuterol 0.5-2.5 (3) MG/3ML Soln Commonly known as: DUONEB Take 3 mLs by nebulization 5 (five) times daily as needed. For shortness of breath/wheezing   isosorbide mononitrate 60 MG 24 hr tablet Commonly known as: IMDUR Take 1 tablet (60 mg total) by mouth daily.   levothyroxine 100 MCG tablet Commonly known as: SYNTHROID Take 100 mcg by mouth daily before breakfast.   Linseed Oil Oil Take 1,000 mg by mouth daily.   Magnesium Oxide 500 MG Tabs Take 500 mg by mouth daily.   mometasone 50 MCG/ACT nasal spray Commonly known as: NASONEX Place 1 spray into the nose  daily.   montelukast 10 MG tablet Commonly known as: SINGULAIR Take 10 mg by mouth at bedtime.   nystatin powder Commonly known as: MYCOSTATIN/NYSTOP Apply topically 3 (three) times daily as needed (for yeast).   potassium chloride SA 20 MEQ tablet Commonly known as: KLOR-CON Take 20 mEq by mouth daily.   pramipexole 1 MG tablet Commonly known as: MIRAPEX Take 2 mg by mouth at bedtime.   rosuvastatin 10 MG tablet Commonly known as: CRESTOR Take 10 mg by mouth daily.   Evaristo Bury FlexTouch 200 UNIT/ML FlexTouch Pen Generic drug: insulin degludec Inject 68 Units into the skin daily.         OBJECTIVE:   Vital Signs: BP 124/70   Pulse 98   Ht 5' 5.5" (1.664 m)   Wt 200 lb (90.7 kg)   SpO2 97%   BMI 32.78 kg/m   Wt Readings from Last 3 Encounters:  06/04/21 200 lb (90.7 kg)  01/28/21 200 lb (90.7 kg)  07/26/20 193 lb 11.2 oz (87.9 kg)     Exam: General: Margaret Lowe appears well and is in NAD  Lungs: Clear with good BS bilat with no rales, rhonchi, or wheezes  Heart: RRR with normal S1 and S2 and no gallops; no murmurs; no rub  Abdomen: Normoactive bowel sounds,  soft, nontender, without masses or organomegaly palpable  Extremities: No pretibial edema.   Neuro: MS is good with appropriate affect, Margaret Lowe is alert and Ox3      DATA REVIEWED:  Lab Results  Component Value Date   HGBA1C 7.5 (A) 06/04/2021   HGBA1C 8.0 (A) 01/29/2021   Lab Results  Component Value Date   LDLCALC 36 01/29/2021   CREATININE 1.33 (H) 01/29/2021      Lab Results  Component Value Date   CHOL 133 01/29/2021   HDL 60.10 01/29/2021   LDLCALC 36 01/29/2021   TRIG 184.0 (H) 01/29/2021   CHOLHDL 2 01/29/2021         ASSESSMENT / PLAN / RECOMMENDATIONS:   1) Type 2 Diabetes Mellitus, with improving Glycemic control , With neuropathic, CKD III and macrovascular  complications - Most recent A1c of 7.5 %. Goal A1c < 7.0 %.     - A1c is improving, limited data today as she did not bring her meter .  - I am going to increase basal insulin  -GLP 1 agonist and DPP 4 inhibitors are CONTRAINDICATED due to history of pancreatitis -We will stop Januvia -I am going to increase her basal insulin -Patient tells me that getting to our office is creating a hardship as she does not always have reliable transportation, patient was advised to continue to follow-up with her PCP as her diabetes control is improving I be happy to answer any questions from the PCP if needed.      MEDICATIONS: STOP Januvia  - Increase Tresiba 74 units daily  - HUmalog 24 units with each meal  -CF: Humalog (BG-130/20)  EDUCATION / INSTRUCTIONS: BG monitoring instructions: Patient is instructed to check her blood sugars 3 times a day, before meals. Call [facility] if: BG persistently < 70  I reviewed the Rule of 15 for the treatment of hypoglycemia in detail with the patient. Literature supplied.  2) Diabetic complications:  Eye: Does not have known diabetic retinopathy.  Neuro/ Feet: Does  have known diabetic peripheral neuropathy .  Renal: Patient does  have known baseline  CKD.      Recommend continuation of current Tx with primary  MD and consultative f/u at [facility] End[facility] clinic in the future if Margaret Lowe's DM control becomes problematic.      Signed electronically by: Lyndle Herrlich, MD  [facility] Endocrinology  [facility] [address] [place] [place], [place] [address] Phone: [phone] FAX: [phone]   CC: [facility], [facility] [address] [place] [address] Phone: [phone]  Fax: [phone]  Return to Endocrinology clinic as below: Future Appointments  Date Time Provider Department Center  07/30/2021  2:00 PM Revankar, Aundra Dubin, MD [facility] None

## 2021-07-26 ENCOUNTER — Other Ambulatory Visit: Payer: Self-pay

## 2021-07-30 ENCOUNTER — Encounter: Payer: Self-pay | Admitting: Cardiology

## 2021-07-30 ENCOUNTER — Other Ambulatory Visit: Payer: Self-pay

## 2021-07-30 ENCOUNTER — Ambulatory Visit (INDEPENDENT_AMBULATORY_CARE_PROVIDER_SITE_OTHER): Payer: Medicare Other | Admitting: Cardiology

## 2021-07-30 VITALS — BP 162/74 | HR 90 | Ht 66.0 in | Wt 198.8 lb

## 2021-07-30 DIAGNOSIS — I700 Atherosclerosis of aorta: Secondary | ICD-10-CM | POA: Diagnosis not present

## 2021-07-30 DIAGNOSIS — E782 Mixed hyperlipidemia: Secondary | ICD-10-CM

## 2021-07-30 DIAGNOSIS — I10 Essential (primary) hypertension: Secondary | ICD-10-CM | POA: Diagnosis not present

## 2021-07-30 NOTE — Patient Instructions (Signed)

## 2021-07-30 NOTE — Progress Notes (Signed)
Cardiology Office Note:    Date:  07/30/2021   ID:  Margaret Lowe, DOB [birth year], MRN [id number]  PCP:  [facility]  Cardiologist:  Garwin Brothers, MD   Referring MD: [facility]    ASSESSMENT:    1. Aortic atherosclerosis (HCC)   2. Essential hypertension   3. Mixed dyslipidemia    PLAN:    In order of problems listed above:  Aortic atherosclerosis: Secondary prevention stressed with the patient.  Importance of compliance with diet medication stressed and she vocalized understanding. COPD exacerbation: Stable and managed by primary care physician and ER visit yesterday.  Her Chem-7 from ER visit was fine Essential hypertension: Blood pressure stable and lifestyle modification was urged.  She tells me that she was under little bit of stress with the ER visit but feels better now.  Her home blood pressure pressures are fine Mixed dyslipidemia, and obesity: Diet was mentioned lifestyle modification was urged and she promises to do better.  Patient is a diabetic also and on statin therapy and lipids followed by primary care. She is has history of thromboembolic episodes and is on Eliquis managed by primary care.  Benefits and potential risks of this explained and she understands and questions were answered to her satisfaction. Patient will be seen in follow-up appointment in 6 months or earlier if the patient has any concerns    Medication Adjustments/Labs and Tests Ordered: Current medicines are reviewed at length with the patient today.  Concerns regarding medicines are outlined above.  No orders of the defined types were placed in this encounter.  No orders of the defined types were placed in this encounter.    No chief complaint on file.    History of Present Illness:    Margaret Lowe is a 65 y.o. female.  She has past medical history of diabetes mellitus essential hypertension mixed dyslipidemia and obesity.  She ambulates minimally and uses a  walker.  She denies any chest pain orthopnea or PND.  She has COPD and went to the emergency room yesterday and was told she had an exacerbation and was given steroid therapy and feels better.  At the time of my evaluation, the patient is alert awake oriented and in no distress.  Past Medical History:  Diagnosis Date   Acquired hypothyroidism 04/10/2016   Acute cystitis without hematuria 05/09/2016   Allergic rhinitis    Carpal tunnel syndrome 08/25/2013   Chest tightness 10/06/2019   Chronic rhinitis    COPD (chronic obstructive pulmonary disease) (HCC) 10/06/2019   COVID-19 10/17/2020   Crohn disease (HCC)    Diabetes mellitus type 2, insulin dependent (HCC) 05/09/2016   Disequilibrium 05/09/2016   Dyspnea    Esophageal reflux    Essential hypertension 01/09/2020   Extrinsic asthma    Fatigue    Fibromyalgia    Generalized anxiety disorder 05/09/2016   H/O total hysterectomy    Hammer toes, bilateral 04/24/2016   History of syncope 05/09/2016   Overview:  Having work-up by Neurology outpatient. Formatting of this note might be different from the original. Overview:  Having work-up by Neurology outpatient.   Hypothyroidism    Idiopathic progressive polyneuropathy 08/25/2013   Impaired functional mobility, balance, gait, and endurance 10/29/2020   Irritable bowel syndrome with constipation 05/09/2016   Major depression    Mixed dyslipidemia 07/26/2020   Neuropathy    Overweight (BMI 25.0-29.9) 04/10/2016   Pain of left great toe 04/24/2016   Physical debility 05/09/2016  Polypharmacy 05/09/2016   Sleep apnea with hypersomnolence    Spell of altered consciousness 03/26/2016   Stroke [facility])    Supplemental oxygen dependent 10/06/2019   Syncope and collapse 10/06/2019   Vitamin D deficiency    Wheelchair bound 05/09/2016    Past Surgical History:  Procedure Laterality Date   CHOLECYSTECTOMY     RIGHT HEART CATHETERIZATION N/A 10/06/2012   Procedure: RIGHT HEART CATH;  Surgeon: Dolores Patty, MD;  Location: [facility] CATH LAB;  Service: Cardiovascular;  Laterality: N/A;    Current Medications: Current Meds  Medication Sig   albuterol (VENTOLIN HFA) 108 (90 Base) MCG/ACT inhaler Inhale 2 puffs into the lungs every 6 (six) hours as needed for wheezing or shortness of breath.   apixaban (ELIQUIS) 5 MG TABS tablet Take 1 tablet (5 mg total) by mouth 2 (two) times daily.   Cyanocobalamin 5000 MCG CAPS Take 5,000 mcg by mouth daily.   fenofibrate (TRICOR) 145 MG tablet Take 145 mg by mouth at bedtime.   Flaxseed Oil (LINSEED OIL) OIL Take 1,000 mg by mouth daily.   fluticasone (FLONASE) 50 MCG/ACT nasal spray Place 1 spray into both nostrils as needed for allergies or rhinitis.   Fluticasone-Salmeterol (ADVAIR HFA IN) Inhale 1-2 puffs into the lungs as needed for wheezing or shortness of breath.   fluvoxaMINE (LUVOX) 100 MG tablet Take 100 mg by mouth 2 (two) times daily.   furosemide (LASIX) 20 MG tablet Take 20 mg by mouth every Monday, Wednesday, and Friday.    insulin degludec (TRESIBA FLEXTOUCH) 200 UNIT/ML FlexTouch Pen Inject 74 Units into the skin daily.   insulin lispro (HUMALOG KWIKPEN) 200 UNIT/ML KwikPen Max daily 110  Units to include correction scale   Insulin Pen Needle 30G X 5 MM MISC 1 Device by Does not apply route in the morning, at noon, in the evening, and at bedtime.   ipratropium-albuterol (DUONEB) 0.5-2.5 (3) MG/3ML SOLN Take 3 mLs by nebulization 5 (five) times daily as needed for wheezing or shortness of breath.   isosorbide mononitrate (IMDUR) 60 MG 24 hr tablet Take 1 tablet (60 mg total) by mouth daily.   levothyroxine (SYNTHROID) 100 MCG tablet Take 100 mcg by mouth daily before breakfast.   Magnesium Oxide 500 MG TABS Take 500 mg by mouth daily.   mometasone (NASONEX) 50 MCG/ACT nasal spray Place 1 spray into the nose daily.    montelukast (SINGULAIR) 10 MG tablet Take 10 mg by mouth at bedtime.   nystatin (MYCOSTATIN/NYSTOP) powder Apply 1 application  topically 3 (three) times daily as needed for rash (for yeast). yeast   pramipexole (MIRAPEX) 1 MG tablet Take 2 mg by mouth at bedtime.    rOPINIRole (REQUIP) 2 MG tablet Take 4 mg by mouth 2 (two) times daily.   rosuvastatin (CRESTOR) 10 MG tablet Take 10 mg by mouth daily.     Allergies:   Cephalexin, Ciprofloxacin, Codeine, Pregabalin, Aspirin, Brexpiprazole, Latex, Januvia [sitagliptin], and Macrodantin [nitrofurantoin macrocrystal]   Social History   Socioeconomic History   Marital status: Widowed    Spouse name: Not on file   Number of children: Not on file   Years of education: Not on file   Highest education level: Not on file  Occupational History   Not on file  Tobacco Use   Smoking status: Former    Types: Cigarettes    Quit date: 10/21/1995    Years since quitting: 25.7   Smokeless tobacco: Never  Vaping Use  Vaping Use: Never used  Substance and Sexual Activity   Alcohol use: Not Currently   Drug use: No   Sexual activity: Not on file  Other Topics Concern   Not on file  Social History Narrative   Not on file   Social Determinants of Health   Financial Resource Strain: Not on file  Food Insecurity: Not on file  Transportation Needs: Not on file  Physical Activity: Not on file  Stress: Not on file  Social Connections: Not on file     Family History: The patient's family history includes Dementia in her father; Heart attack in her brother, brother, and mother.  ROS:   Please see the history of present illness.    All other systems reviewed and are negative.  EKGs/Labs/Other Studies Reviewed:    The following studies were reviewed today: I discussed my findings with the patient at length   Recent Labs: 01/29/2021: BUN 34; Creatinine, Ser 1.33; Potassium 4.3; Sodium 138  Recent Lipid Panel    Component Value Date/Time   CHOL 133 01/29/2021 1512   CHOL 158 03/26/2020 0931   TRIG 184.0 (H) 01/29/2021 1512   HDL 60.10 01/29/2021 1512   HDL 72  03/26/2020 0931   CHOLHDL 2 01/29/2021 1512   VLDL 36.8 01/29/2021 1512   LDLCALC 36 01/29/2021 1512   LDLCALC 67 03/26/2020 0931    Physical Exam:    VS:  BP (!) 162/74   Pulse 90   Ht 5\' 6"  (1.676 m)   Wt 198 lb 12.8 oz (90.2 kg)   SpO2 96%   BMI 32.09 kg/m     Wt Readings from Last 3 Encounters:  07/30/21 198 lb 12.8 oz (90.2 kg)  06/04/21 200 lb (90.7 kg)  01/28/21 200 lb (90.7 kg)     GEN: Patient is in no acute distress HEENT: Normal NECK: No JVD; No carotid bruits LYMPHATICS: No lymphadenopathy CARDIAC: Hear sounds regular, 2/6 systolic murmur at the apex. RESPIRATORY:  Clear to auscultation without rales, wheezing or rhonchi  ABDOMEN: Soft, non-tender, non-distended MUSCULOSKELETAL:  No edema; No deformity  SKIN: Warm and dry NEUROLOGIC:  Alert and oriented x 3 PSYCHIATRIC:  Normal affect   Signed, 03/30/21, MD  07/30/2021 2:27 PM    [facility]

## 2022-01-16 DIAGNOSIS — I361 Nonrheumatic tricuspid (valve) insufficiency: Secondary | ICD-10-CM | POA: Diagnosis not present

## 2022-01-17 DIAGNOSIS — I4510 Unspecified right bundle-branch block: Secondary | ICD-10-CM | POA: Diagnosis not present

## 2022-03-12 ENCOUNTER — Ambulatory Visit: Payer: Medicare Other | Admitting: Cardiology

## 2022-03-21 ENCOUNTER — Ambulatory Visit: Payer: Medicare Other | Admitting: Cardiology

## 2022-05-03 DIAGNOSIS — I4510 Unspecified right bundle-branch block: Secondary | ICD-10-CM

## 2022-05-04 DIAGNOSIS — I4510 Unspecified right bundle-branch block: Secondary | ICD-10-CM
